# Patient Record
Sex: Female | Born: 1985 | Race: White | Hispanic: No | Marital: Married | Smoking: Never smoker
Health system: Southern US, Community
[De-identification: ages and names within clinical notes are randomized; demographics above are authoritative.]

## PROBLEM LIST (undated history)

## (undated) ENCOUNTER — Inpatient Hospital Stay (HOSPITAL_COMMUNITY): Payer: Self-pay

## (undated) DIAGNOSIS — K219 Gastro-esophageal reflux disease without esophagitis: Secondary | ICD-10-CM

## (undated) DIAGNOSIS — F419 Anxiety disorder, unspecified: Secondary | ICD-10-CM

## (undated) DIAGNOSIS — E039 Hypothyroidism, unspecified: Secondary | ICD-10-CM

## (undated) HISTORY — PX: WISDOM TOOTH EXTRACTION: SHX21

---

## 2017-06-15 ENCOUNTER — Encounter (HOSPITAL_COMMUNITY): Payer: Self-pay | Admitting: *Deleted

## 2017-06-15 ENCOUNTER — Other Ambulatory Visit: Payer: Self-pay

## 2017-06-15 NOTE — Anesthesia Preprocedure Evaluation (Addendum)
Anesthesia Evaluation  Patient identified by MRN, date of birth, ID band Patient awake    Reviewed: Allergy & Precautions, NPO status , Patient's Chart, lab work & pertinent test results  Airway Mallampati: II  TM Distance: >3 FB Neck ROM: Full    Dental no notable dental hx. (+) Dental Advisory Given, Teeth Intact   Pulmonary neg pulmonary ROS,    Pulmonary exam normal breath sounds clear to auscultation       Cardiovascular Exercise Tolerance: Good negative cardio ROS Normal cardiovascular exam Rhythm:Regular Rate:Normal     Neuro/Psych negative neurological ROS  negative psych ROS   GI/Hepatic negative GI ROS, Neg liver ROS,   Endo/Other  negative endocrine ROS  Renal/GU negative Renal ROS  negative genitourinary   Musculoskeletal negative musculoskeletal ROS (+)   Abdominal   Peds negative pediatric ROS (+)  Hematology negative hematology ROS (+)   Anesthesia Other Findings   Reproductive/Obstetrics negative OB ROS                             Anesthesia Physical Anesthesia Plan  ASA: II  Anesthesia Plan: General   Post-op Pain Management:    Induction: Intravenous  PONV Risk Score and Plan: Dexamethasone and Ondansetron  Airway Management Planned: LMA  Additional Equipment:   Intra-op Plan:   Post-operative Plan:   Informed Consent: I have reviewed the patients History and Physical, chart, labs and discussed the procedure including the risks, benefits and alternatives for the proposed anesthesia with the patient or authorized representative who has indicated his/her understanding and acceptance.     Plan Discussed with: CRNA  Anesthesia Plan Comments:         Anesthesia Quick Evaluation

## 2017-06-15 NOTE — H&P (Signed)
Anna Long is an 32 y.o. female [phone] with unfortunate finding of missed miscarriage.  She had [place] confirmed non-viable IUP with lagging CRL fetus that lost FHT's on 2 subsequent [place] with no growth.  We reviewed all options and she tried expectant management for one week followed by cytotec with no significant bleeding.  At this point she wants to proceed with D&E.    Pertinent Gynecological History: OB History: SAB x 1          C-section x 1-- 2015   Menstrual History:  Patient's last menstrual period was 04/04/2017.    Past Medical History:  Diagnosis Date  . Anxiety    no meds currently  . GERD (gastroesophageal reflux disease)    no meds, diet controlled  . Hypothyroidism     Past Surgical History:  Procedure Laterality Date  . CESAREAN SECTION     x 1 in [facility]  . WISDOM TOOTH EXTRACTION      History reviewed. No pertinent family history.  Social History:  reports that she has never smoked. She has never used smokeless tobacco. She reports that she drank alcohol. She reports that she does not use drugs.  Allergies:  Allergies  Allergen Reactions  . Zithromax [Azithromycin] Other (See Comments)    Was told not to take this medication by PCP    No medications prior to admission.    Review of Systems  Gastrointestinal: Negative for abdominal pain.  Neurological: Negative for headaches.    Height 5' (1.524 m), weight 135 lb (61.2 kg), last menstrual period 04/04/2017. Physical Exam  Constitutional: She appears well-developed.  Cardiovascular: Normal rate and regular rhythm.  Respiratory: Effort normal.  GI: Soft.  Genitourinary: Vagina normal.  Musculoskeletal: Normal range of motion.  Psychiatric: She has a normal mood and affect.    No results found for this or any previous visit (from the past 24 hour(s)).  No results found.  Assessment/Plan: Pt was counseled on risks and benefits of dilation and evacuation including bleeding, infection  and uterine perforation.  She will use cytotec 3 hours prior to procedure to help dilate the cervix.  We will confirm blood type in hospital.   Oliver Pila 06/15/2017, 6:13 PM

## 2017-06-16 ENCOUNTER — Ambulatory Visit (HOSPITAL_COMMUNITY)
Admission: RE | Admit: 2017-06-16 | Discharge: 2017-06-16 | Disposition: A | Discharge status: Home / Self Care | Payer: Self-pay | Source: Ambulatory Visit | Attending: Obstetrics and Gynecology | Admitting: Obstetrics and Gynecology

## 2017-06-16 ENCOUNTER — Encounter (HOSPITAL_COMMUNITY)
Admission: RE | Disposition: A | Discharge status: Home / Self Care | Payer: Self-pay | Source: Ambulatory Visit | Attending: Obstetrics and Gynecology

## 2017-06-16 ENCOUNTER — Ambulatory Visit (HOSPITAL_COMMUNITY): Payer: Self-pay | Admitting: Anesthesiology

## 2017-06-16 ENCOUNTER — Encounter (HOSPITAL_COMMUNITY): Payer: Self-pay | Admitting: Anesthesiology

## 2017-06-16 DIAGNOSIS — K219 Gastro-esophageal reflux disease without esophagitis: Secondary | ICD-10-CM | POA: Insufficient documentation

## 2017-06-16 DIAGNOSIS — O021 Missed abortion: Secondary | ICD-10-CM | POA: Insufficient documentation

## 2017-06-16 HISTORY — DX: Hypothyroidism, unspecified: E03.9

## 2017-06-16 HISTORY — DX: Anxiety disorder, unspecified: F41.9

## 2017-06-16 HISTORY — DX: Gastro-esophageal reflux disease without esophagitis: K21.9

## 2017-06-16 HISTORY — PX: DILATION AND EVACUATION: SHX1459

## 2017-06-16 LAB — CBC
HEMATOCRIT: 38.8 % (ref 36.0–46.0)
Hemoglobin: 13.2 g/dL (ref 12.0–15.0)
MCH: 32 pg (ref 26.0–34.0)
MCHC: 34 g/dL (ref 30.0–36.0)
MCV: 93.9 fL (ref 78.0–100.0)
PLATELETS: 312 10*3/uL (ref 150–400)
RBC: 4.13 MIL/uL (ref 3.87–5.11)
RDW: 14.9 % (ref 11.5–15.5)
WBC: 7.6 10*3/uL (ref 4.0–10.5)

## 2017-06-16 LAB — TYPE AND SCREEN
ABO/RH(D): O POS
Antibody Screen: NEGATIVE

## 2017-06-16 LAB — ABO/RH: ABO/RH(D): O POS

## 2017-06-16 SURGERY — DILATION AND EVACUATION, UTERUS
Anesthesia: General | Site: Vagina

## 2017-06-16 MED ORDER — PROPOFOL 10 MG/ML IV BOLUS
INTRAVENOUS | Status: AC
  Filled 2017-06-16: qty 40

## 2017-06-16 MED ORDER — ACETAMINOPHEN 10 MG/ML IV SOLN: 1000.0000 mg | Freq: Once | INTRAVENOUS | Status: DC | PRN

## 2017-06-16 MED ORDER — SCOPOLAMINE 1 MG/3DAYS TD PT72
MEDICATED_PATCH | TRANSDERMAL | Status: AC
  Administered 2017-06-16: 1.5 mg via TRANSDERMAL
  Filled 2017-06-16: qty 1

## 2017-06-16 MED ORDER — FENTANYL CITRATE (PF) 100 MCG/2ML IJ SOLN
INTRAMUSCULAR | Status: AC
  Filled 2017-06-16: qty 4

## 2017-06-16 MED ORDER — GLYCOPYRROLATE 0.2 MG/ML IJ SOLN
INTRAMUSCULAR | Status: DC | PRN
  Administered 2017-06-16: .05 mg via INTRAVENOUS

## 2017-06-16 MED ORDER — LIDOCAINE HCL 1 % IJ SOLN
INTRAMUSCULAR | Status: DC | PRN
  Administered 2017-06-16: 20 mL

## 2017-06-16 MED ORDER — FENTANYL CITRATE (PF) 250 MCG/5ML IJ SOLN
INTRAMUSCULAR | Status: DC | PRN
  Administered 2017-06-16: 100 ug via INTRAVENOUS

## 2017-06-16 MED ORDER — KETOROLAC TROMETHAMINE 30 MG/ML IJ SOLN
INTRAMUSCULAR | Status: AC
  Filled 2017-06-16: qty 1

## 2017-06-16 MED ORDER — DEXAMETHASONE SODIUM PHOSPHATE 4 MG/ML IJ SOLN
INTRAMUSCULAR | Status: DC | PRN
Start: 2017-06-16 — End: 2017-06-16
  Administered 2017-06-16: 10 mg via INTRAVENOUS

## 2017-06-16 MED ORDER — METOCLOPRAMIDE HCL 5 MG/ML IJ SOLN
INTRAMUSCULAR | Status: DC | PRN
  Administered 2017-06-16: 10 mg via INTRAVENOUS

## 2017-06-16 MED ORDER — METOCLOPRAMIDE HCL 5 MG/ML IJ SOLN
INTRAMUSCULAR | Status: AC
  Filled 2017-06-16: qty 2

## 2017-06-16 MED ORDER — LIDOCAINE HCL 1 % IJ SOLN
INTRAMUSCULAR | Status: AC
  Filled 2017-06-16: qty 20

## 2017-06-16 MED ORDER — HYDROCODONE-ACETAMINOPHEN 7.5-325 MG PO TABS: 1.0000 | ORAL_TABLET | Freq: Once | ORAL | Status: DC | PRN

## 2017-06-16 MED ORDER — PROMETHAZINE HCL 25 MG/ML IJ SOLN: 6.2500 mg | INTRAMUSCULAR | Status: DC | PRN

## 2017-06-16 MED ORDER — DIPHENHYDRAMINE HCL 50 MG/ML IJ SOLN
INTRAMUSCULAR | Status: DC | PRN
  Administered 2017-06-16: 12.5 mg via INTRAVENOUS

## 2017-06-16 MED ORDER — PROPOFOL 10 MG/ML IV BOLUS
INTRAVENOUS | Status: DC | PRN
  Administered 2017-06-16: 160 mg via INTRAVENOUS

## 2017-06-16 MED ORDER — GLYCOPYRROLATE 0.2 MG/ML IJ SOLN
INTRAMUSCULAR | Status: AC
  Filled 2017-06-16: qty 1

## 2017-06-16 MED ORDER — LACTATED RINGERS IV SOLN
INTRAVENOUS | Status: DC
  Administered 2017-06-16: 125 mL/h via INTRAVENOUS
  Administered 2017-06-16: 15:00:00 via INTRAVENOUS

## 2017-06-16 MED ORDER — MIDAZOLAM HCL 2 MG/2ML IJ SOLN
INTRAMUSCULAR | Status: AC
  Filled 2017-06-16: qty 2

## 2017-06-16 MED ORDER — LIDOCAINE HCL (CARDIAC) PF 100 MG/5ML IV SOSY
PREFILLED_SYRINGE | INTRAVENOUS | Status: AC
  Filled 2017-06-16: qty 5

## 2017-06-16 MED ORDER — MEPERIDINE HCL 25 MG/ML IJ SOLN
6.2500 mg | INTRAMUSCULAR | Status: DC | PRN
  Administered 2017-06-16: 6.25 mg via INTRAVENOUS

## 2017-06-16 MED ORDER — DIPHENHYDRAMINE HCL 50 MG/ML IJ SOLN
INTRAMUSCULAR | Status: AC
  Filled 2017-06-16: qty 1

## 2017-06-16 MED ORDER — MIDAZOLAM HCL 2 MG/2ML IJ SOLN
INTRAMUSCULAR | Status: DC | PRN
  Administered 2017-06-16: 1 mg via INTRAVENOUS

## 2017-06-16 MED ORDER — LIDOCAINE HCL (CARDIAC) PF 100 MG/5ML IV SOSY
PREFILLED_SYRINGE | INTRAVENOUS | Status: DC | PRN
  Administered 2017-06-16: 100 mg via INTRAVENOUS

## 2017-06-16 MED ORDER — ONDANSETRON HCL 4 MG/2ML IJ SOLN
INTRAMUSCULAR | Status: AC
Start: 2017-06-16 — End: ?
  Filled 2017-06-16: qty 2

## 2017-06-16 MED ORDER — IBUPROFEN 600 MG PO TABS: 600.0000 mg | ORAL_TABLET | Freq: Four times a day (QID) | ORAL | 0 refills | Status: DC | PRN

## 2017-06-16 MED ORDER — HYDROMORPHONE HCL 1 MG/ML IJ SOLN: 0.2500 mg | INTRAMUSCULAR | Status: DC | PRN

## 2017-06-16 MED ORDER — LACTATED RINGERS IV SOLN: INTRAVENOUS | Status: DC

## 2017-06-16 MED ORDER — SCOPOLAMINE 1 MG/3DAYS TD PT72
1.0000 | MEDICATED_PATCH | Freq: Once | TRANSDERMAL | Status: DC
  Administered 2017-06-16: 1.5 mg via TRANSDERMAL

## 2017-06-16 MED ORDER — KETOROLAC TROMETHAMINE 30 MG/ML IJ SOLN
INTRAMUSCULAR | Status: DC | PRN
  Administered 2017-06-16: 30 mg via INTRAVENOUS

## 2017-06-16 MED ORDER — MEPERIDINE HCL 25 MG/ML IJ SOLN
INTRAMUSCULAR | Status: AC
  Filled 2017-06-16: qty 1

## 2017-06-16 MED ORDER — ONDANSETRON HCL 4 MG/2ML IJ SOLN
INTRAMUSCULAR | Status: DC | PRN
  Administered 2017-06-16: 4 mg via INTRAVENOUS

## 2017-06-16 MED ORDER — DEXAMETHASONE SODIUM PHOSPHATE 10 MG/ML IJ SOLN
INTRAMUSCULAR | Status: AC
  Filled 2017-06-16: qty 1

## 2017-06-16 SURGICAL SUPPLY — 18 items
CATH ROBINSON RED A/P 16FR (CATHETERS) ×3 IMPLANT
DECANTER SPIKE VIAL GLASS SM (MISCELLANEOUS) ×3 IMPLANT
GLOVE BIO SURGEON STRL SZ 6.5 (GLOVE) ×2 IMPLANT
GLOVE BIO SURGEONS STRL SZ 6.5 (GLOVE) ×1
GLOVE BIOGEL PI IND STRL 7.0 (GLOVE) ×1 IMPLANT
GLOVE BIOGEL PI INDICATOR 7.0 (GLOVE) ×2
GOWN STRL REUS W/TWL LRG LVL3 (GOWN DISPOSABLE) ×6 IMPLANT
KIT BERKELEY 1ST TRIMESTER 3/8 (MISCELLANEOUS) ×3 IMPLANT
NS IRRIG 1000ML POUR BTL (IV SOLUTION) ×3 IMPLANT
PACK VAGINAL MINOR WOMEN LF (CUSTOM PROCEDURE TRAY) ×3 IMPLANT
PAD OB MATERNITY 4.3X12.25 (PERSONAL CARE ITEMS) ×3 IMPLANT
PAD PREP 24X48 CUFFED NSTRL (MISCELLANEOUS) ×3 IMPLANT
SET BERKELEY SUCTION TUBING (SUCTIONS) ×3 IMPLANT
TOWEL OR 17X24 6PK STRL BLUE (TOWEL DISPOSABLE) ×6 IMPLANT
VACURETTE 10 RIGID CVD (CANNULA) IMPLANT
VACURETTE 7MM CVD STRL WRAP (CANNULA) IMPLANT
VACURETTE 8 RIGID CVD (CANNULA) ×3 IMPLANT
VACURETTE 9 RIGID CVD (CANNULA) IMPLANT

## 2017-06-16 NOTE — Anesthesia Postprocedure Evaluation (Signed)
Anesthesia Post Note  Patient: Anna Long  Procedure(s) Performed: DILATATION AND EVACUATION (N/A Vagina )     Patient location during evaluation: PACU Anesthesia Type: General Level of consciousness: awake and alert Pain management: pain level controlled Vital Signs Assessment: post-procedure vital signs reviewed and stable Respiratory status: spontaneous breathing, nonlabored ventilation, respiratory function stable and patient connected to nasal cannula oxygen Cardiovascular status: blood pressure returned to baseline and stable Postop Assessment: no apparent nausea or vomiting Anesthetic complications: no    Last Vitals:  Vitals:   06/16/17 1430 06/16/17 1445  BP: 101/71 99/69  Pulse: 76 74  Resp: 20 16  Temp:    SpO2: 100% 98%    Last Pain:  Vitals:   06/16/17 1445  TempSrc:   PainSc: 0-No pain   Pain Goal: Patients Stated Pain Goal: 3 (06/16/17 1445)               Trevor Iha

## 2017-06-16 NOTE — Discharge Instructions (Signed)
DISCHARGE INSTRUCTIONS: D&C / D&E The following instructions have been prepared to help you care for yourself upon your return home.   Personal hygiene:  Use sanitary pads for vaginal drainage, not tampons.  Shower the day after your procedure.  NO tub baths, pools or Jacuzzis for 2-3 weeks.  Wipe front to back after using the bathroom.  Activity and limitations:  Do NOT drive or operate any equipment for 24 hours. The effects of anesthesia are still present and drowsiness may result.  Do NOT rest in bed all day.  Walking is encouraged.  Walk up and down stairs slowly.  You may resume your normal activity in one to two days or as indicated by your physician.  Sexual activity: NO intercourse for at least 2 weeks after the procedure, or as indicated by your physician.  Diet: Eat a light meal as desired this evening. You may resume your usual diet tomorrow.  Return to work: You may resume your work activities in one to two days or as indicated by your doctor.  What to expect after your surgery: Expect to have vaginal bleeding/discharge for 2-3 days and spotting for up to 10 days. It is not unusual to have soreness for up to 1-2 weeks. You may have a slight burning sensation when you urinate for the first day. Mild cramps may continue for a couple of days. You may have a regular period in 2-6 weeks.  Call your doctor for any of the following:  Excessive vaginal bleeding, saturating and changing one pad every hour.  Inability to urinate 6 hours after discharge from hospital.  Pain not relieved by pain medication.  Fever of 100.4 F or greater.  Unusual vaginal discharge or odor.  Special Instructions/Symptoms: Your throat may feel dry or sore from the anesthesia or the breathing tube placed in your throat during surgery. If this causes discomfort, gargle with warm salt water. The discomfort should disappear within 24 hours.  If you had a scopolamine patch placed behind your  ear for the management of post- operative nausea and/or vomiting:  1. The medication in the patch is effective for 72 hours, after which it should be removed.  Wrap patch in a tissue and discard in the trash. Wash hands thoroughly with soap and water. 2. You may remove the patch earlier than 72 hours if you experience unpleasant side effects which may include dry mouth, dizziness or visual disturbances. 3. Avoid touching the patch. Wash your hands with soap and water after contact with the patch.

## 2017-06-16 NOTE — Transfer of Care (Signed)
Immediate Anesthesia Transfer of Care Note  Patient: Anna Long  Procedure(s) Performed: DILATATION AND EVACUATION (N/A Vagina )  Patient Location: PACU  Anesthesia Type:General  Level of Consciousness: awake, alert  and oriented  Airway & Oxygen Therapy: Patient Spontanous Breathing and Patient connected to nasal cannula oxygen  Post-op Assessment: Report given to RN and Post -op Vital signs reviewed and stable  Post vital signs: Reviewed and stable  Last Vitals:  Vitals Value Taken Time  BP    Temp    Pulse 97 06/16/2017  2:23 PM  Resp    SpO2 100 % 06/16/2017  2:23 PM  Vitals shown include unvalidated device data.  Last Pain:  Vitals:   06/16/17 1208  TempSrc: Oral  PainSc: 0-No pain      Patients Stated Pain Goal: 3 (06/16/17 1208)  Complications: No apparent anesthesia complications

## 2017-06-16 NOTE — Progress Notes (Signed)
Patient ID: Anna Long, female   DOB: 1985/08/17, 32 y.o.   MRN: [id number] Per pt no changes in dictated H&P.  No further VB.  She feels minimal cramping.  Brief exam WNL.

## 2017-06-16 NOTE — Anesthesia Procedure Notes (Signed)
Procedure Name: LMA Insertion Date/Time: 06/16/2017 1:46 PM Performed by: Graciela Husbands, CRNA Pre-anesthesia Checklist: Patient identified, Patient being monitored, Emergency Drugs available, Timeout performed and Suction available Patient Re-evaluated:Patient Re-evaluated prior to induction Oxygen Delivery Method: Circle System Utilized Preoxygenation: Pre-oxygenation with 100% oxygen Induction Type: IV induction Ventilation: Mask ventilation without difficulty LMA: LMA inserted LMA Size: 4.0 Number of attempts: 1 Placement Confirmation: positive ETCO2 and breath sounds checked- equal and bilateral Tube secured with: Tape Dental Injury: Teeth and Oropharynx as per pre-operative assessment

## 2017-06-16 NOTE — Op Note (Signed)
Operative Note    Preoperative Diagnosis Missed miscarriage at 9+weeks  Postoperative Diagnosis same  Procedure Suction Dilation and Evacuation  Surgeon Huel Cote, MD  Anesthesia LMA  Fluids: EBL 75mL UOP 75mL IVF  Findings Uterus retroverted and 9-10 weeks size.  Moderate amount of POC's obtained  Specimen POC's  Procedure Note Pt was taken to operating room where LMA anesthesia was obtained without difficulty.  She was prepped and draped in the normal sterile fashion in the dorsal lithotomy position.  An appropriate timeout was performed.  A speculum was placed in the vagina and 2cc of 1% plain lidocaine injected into the anterior cervix.  An additional 9cc was placed both at 2 and 10 o'clock for a paracervical block.  A single tooth tenaculum was placed on the anterior lip of the cervix. The cervix was slightly dilated from the pt's cytotec treatment and  sound was easily introduced into the uterus and the uterus sounded to about 9cm.  The 8 mm suction currette was obtained and introduced into the fundus.  With several passes, a moderate amount of tissue was obtained.  When no further tissue was noted, the suction was discontinued and a gentle currettage performed.  One additional pass revealed no further tissue, and the procedure was completed.  The tenaculum was removed and silver nitrate used for hemostasis.  All instruments and sponges were removed from vagina and the patient awakened and taken to the PACU in good condition.

## 2017-06-17 ENCOUNTER — Encounter (HOSPITAL_COMMUNITY): Payer: Self-pay | Admitting: Obstetrics and Gynecology

## 2018-02-02 NOTE — L&D Delivery Note (Signed)
Delivery Note Pt progressed to complete dilation and pushed great about 15 minutes.  At 1:27 PM a non-viable female was delivered via VBAC, Spontaneous (Presentation: OA ).  APGAR:0 ,0 ; weight pending .   Placenta status: placenta delivered with the baby, intact.   Cord:  with the following complications: none .    Anesthesia:  Epidural Episiotomy: None Lacerations: None Suture Repair: none Est. Blood Loss (mL): 146mL  Baby left with parents and d/w them they could have as much time as needed.  We discussed options of burial and cremation and they will consider. Pt will let me know if wants d/c later this PM Placenta not sent to path given known cause of fetal death.  Logan Bores 12/25/18, 1:47 PM

## 2018-11-23 ENCOUNTER — Telehealth: Payer: Self-pay | Admitting: Neonatology

## 2018-11-23 NOTE — Progress Notes (Signed)
Telemedicine consultation (via WebEx) requested by Dr. 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  I spoke with patient and her husband via WebEx, discussing expectations for care of baby after his birth (planned repeat C/section probably about [redacted] wks gestation).  They expressed desire for comfort care but would like to have him supported as needed until he can be further assessed postnatally (echocardiogram, chromosomes, etc.). They indicated they would probably not want heroic or invasive interventions if Trisomy 18 is the clinical impression (not necessarily waiting for chromosomal confirmation). They hope he will survive at least long enough to be discharged home, possibly with NG feedings and other support if needed.  They also requested his sibling be allowed to visit in the hospital if survival to discharge is not possible.  We discussed possible admission to NICU couplet care (if resources are available), agreed to make decisions about ongoing respiratory support, fluids, nutrition, and further diagnostic evaluation after his birth.  Will ask Dr. Marvel Plan to update me with any changes in mother's or baby's  clinical condition or delivery plans.  Also advised patient and FOB to contact me via Idell Pickles, [phone] if they have further questions/concerns prior to her admission for delivery.  Thank you for consulting Neonatology. Brent Taillon E. Burney Gauze., MD Neonatologist  Total time 45 minutes - face-to-face (via WebEx) 25 minutes.  Marland Kitchen

## 2018-11-24 ENCOUNTER — Encounter: Payer: Self-pay | Admitting: Neonatology

## 2018-11-24 DIAGNOSIS — O351XX0 Maternal care for (suspected) chromosomal abnormality in fetus, not applicable or unspecified: Secondary | ICD-10-CM | POA: Insufficient documentation

## 2018-11-24 DIAGNOSIS — O3512X0 Maternal care for (suspected) chromosomal abnormality in fetus, trisomy 18, not applicable or unspecified: Secondary | ICD-10-CM | POA: Insufficient documentation

## 2018-12-07 ENCOUNTER — Other Ambulatory Visit: Payer: Self-pay | Admitting: Obstetrics and Gynecology

## 2018-12-07 NOTE — H&P (Signed)
Anna Long is a 33 y.o. female [id number] at 45 0/7 weeks(EDD 02/23/19 by LMP c/w 6 week [place]) with IUFD  of known trisomy 96 baby by [facility] that patient had elected to manage expectantly.   Pt has a h/o prior c-section for fetal indications thus is a VBAC.  She also has a h/o hypothyroidism and labs stable on medications.  She has anxiety as well, coping with no medications.    Past Medical History:  Diagnosis Date  . Anxiety    no meds currently  . GERD (gastroesophageal reflux disease)    no meds, diet controlled  . Hypothyroidism    Past Surgical History:  Procedure Laterality Date  . CESAREAN SECTION     x 1 in [place] N/A 06/16/2017   Procedure: DILATATION AND EVACUATION;  Surgeon: Paula Compton, MD;  Location: [facility];  Service: Gynecology;  Laterality: N/A;  . WISDOM TOOTH EXTRACTION     Family History: family history is not on file. Social History:  reports that she has never smoked. She has never used smokeless tobacco. She reports previous alcohol use. She reports that she does not use drugs.     Maternal Diabetes: No Genetic Screening: Abnormal:  Results: Trisomy 18 Maternal Ultrasounds/Referrals: Normal Fetal Ultrasounds or other Referrals:  None Maternal Substance Abuse:  No Significant Maternal Medications:  Meds include: Syntroid Significant Maternal Lab Results:  None Other Comments:  None  Review of Systems  Cardiovascular: Negative for palpitations.  Gastrointestinal: Negative for abdominal pain and nausea.   Maternal Medical History:  Reason for admission: Nausea.  Contractions: Frequency: rare.    Fetal activity: Perceived fetal activity is none.    Prenatal complications: IUFD of trisomy 62 baby Prior LTCS h/o SAB x 2 Hypothyroidism AMA      There were no vitals taken for this visit. Maternal Exam:  Uterine Assessment: Contraction strength is mild.  Contraction frequency is rare.   Abdomen: Patient  reports no abdominal tenderness. Surgical scars: low transverse.   Fetal presentation: vertex  Introitus: Normal vulva. Normal vagina.    Physical Exam  Constitutional: She appears well-developed.  Cardiovascular: Normal rate and regular rhythm.  Respiratory: Effort normal.  GI: Soft.  Genitourinary:    Vulva and vagina normal.   Neurological: She is alert.  Psychiatric: She has a normal mood and affect.    Prenatal labs: ABO, Rh:   O positive Antibody:  negative Rubella:   immune RPR:   NR HBsAg:   neg HIV:   NR One hour GCT 145 Three hour GTT WNL     Assessment/Plan: Pt with IUFD at 84 weeks with h/o prior c-section.  Attempted to place a foley bulb but patient was not able to tolerate. We will start pitocin and see how responds.  Plan d/w pt and husband in detail.  Epidural when needed.   Anna Long 12/07/2018, 6:05 PM

## 2018-12-07 NOTE — H&P (Deleted)
  The note originally documented on this encounter has been moved the the encounter in which it belongs.  

## 2018-12-08 ENCOUNTER — Encounter (HOSPITAL_COMMUNITY): Payer: Self-pay

## 2018-12-08 ENCOUNTER — Inpatient Hospital Stay (HOSPITAL_COMMUNITY): Payer: Self-pay

## 2018-12-08 ENCOUNTER — Inpatient Hospital Stay (HOSPITAL_COMMUNITY): Payer: Self-pay | Admitting: Anesthesiology

## 2018-12-08 ENCOUNTER — Other Ambulatory Visit: Payer: Self-pay

## 2018-12-08 ENCOUNTER — Inpatient Hospital Stay (HOSPITAL_COMMUNITY)
Admission: AD | Admit: 2018-12-08 | Discharge: 2018-12-09 | DRG: 807 | Disposition: A | Discharge status: Home / Self Care | Payer: Self-pay | Attending: Obstetrics and Gynecology | Admitting: Obstetrics and Gynecology

## 2018-12-08 DIAGNOSIS — O351XX0 Maternal care for (suspected) chromosomal abnormality in fetus, not applicable or unspecified: Secondary | ICD-10-CM | POA: Diagnosis present

## 2018-12-08 DIAGNOSIS — O99284 Endocrine, nutritional and metabolic diseases complicating childbirth: Secondary | ICD-10-CM | POA: Diagnosis present

## 2018-12-08 DIAGNOSIS — F419 Anxiety disorder, unspecified: Secondary | ICD-10-CM | POA: Diagnosis present

## 2018-12-08 DIAGNOSIS — O99344 Other mental disorders complicating childbirth: Secondary | ICD-10-CM | POA: Diagnosis present

## 2018-12-08 DIAGNOSIS — O364XX0 Maternal care for intrauterine death, not applicable or unspecified: Principal | ICD-10-CM | POA: Diagnosis present

## 2018-12-08 DIAGNOSIS — O34211 Maternal care for low transverse scar from previous cesarean delivery: Secondary | ICD-10-CM | POA: Diagnosis present

## 2018-12-08 DIAGNOSIS — Z20828 Contact with and (suspected) exposure to other viral communicable diseases: Secondary | ICD-10-CM | POA: Diagnosis present

## 2018-12-08 DIAGNOSIS — E039 Hypothyroidism, unspecified: Secondary | ICD-10-CM | POA: Diagnosis present

## 2018-12-08 DIAGNOSIS — Z3A29 29 weeks gestation of pregnancy: Secondary | ICD-10-CM

## 2018-12-08 LAB — CBC
HCT: 38.2 % (ref 36.0–46.0)
Hemoglobin: 13.1 g/dL (ref 12.0–15.0)
MCH: 31.7 pg (ref 26.0–34.0)
MCHC: 34.3 g/dL (ref 30.0–36.0)
MCV: 92.5 fL (ref 80.0–100.0)
Platelets: 290 10*3/uL (ref 150–400)
RBC: 4.13 MIL/uL (ref 3.87–5.11)
RDW: 13.7 % (ref 11.5–15.5)
WBC: 9 10*3/uL (ref 4.0–10.5)
nRBC: 0 % (ref 0.0–0.2)

## 2018-12-08 LAB — RPR: RPR Ser Ql: NONREACTIVE

## 2018-12-08 LAB — TYPE AND SCREEN
ABO/RH(D): O POS
Antibody Screen: NEGATIVE

## 2018-12-08 LAB — ABO/RH: ABO/RH(D): O POS

## 2018-12-08 LAB — SARS CORONAVIRUS 2 BY RT PCR (HOSPITAL ORDER, PERFORMED IN ~~LOC~~ HOSPITAL LAB): SARS Coronavirus 2: NEGATIVE

## 2018-12-08 MED ORDER — ACETAMINOPHEN 325 MG PO TABS
650.0000 mg | ORAL_TABLET | ORAL | Status: DC | PRN
  Administered 2018-12-08: 650 mg via ORAL
  Filled 2018-12-08: qty 2

## 2018-12-08 MED ORDER — LIDOCAINE HCL (PF) 1 % IJ SOLN: 30.0000 mL | INTRAMUSCULAR | Status: DC | PRN

## 2018-12-08 MED ORDER — DIBUCAINE (PERIANAL) 1 % EX OINT: 1.0000 "application " | TOPICAL_OINTMENT | CUTANEOUS | Status: DC | PRN

## 2018-12-08 MED ORDER — LEVOTHYROXINE SODIUM 25 MCG PO TABS
75.0000 ug | ORAL_TABLET | Freq: Every day | ORAL | Status: DC
  Administered 2018-12-09: 75 ug via ORAL
  Filled 2018-12-08: qty 3

## 2018-12-08 MED ORDER — WITCH HAZEL-GLYCERIN EX PADS: 1.0000 "application " | MEDICATED_PAD | CUTANEOUS | Status: DC | PRN

## 2018-12-08 MED ORDER — BENZOCAINE-MENTHOL 20-0.5 % EX AERO: 1.0000 "application " | INHALATION_SPRAY | CUTANEOUS | Status: DC | PRN

## 2018-12-08 MED ORDER — PHENYLEPHRINE 40 MCG/ML (10ML) SYRINGE FOR IV PUSH (FOR BLOOD PRESSURE SUPPORT): 80.0000 ug | PREFILLED_SYRINGE | INTRAVENOUS | Status: DC | PRN

## 2018-12-08 MED ORDER — LACTATED RINGERS IV SOLN: 500.0000 mL | Freq: Once | INTRAVENOUS | Status: DC

## 2018-12-08 MED ORDER — OXYCODONE-ACETAMINOPHEN 5-325 MG PO TABS: 1.0000 | ORAL_TABLET | ORAL | Status: DC | PRN

## 2018-12-08 MED ORDER — DIPHENHYDRAMINE HCL 50 MG/ML IJ SOLN
12.5000 mg | INTRAMUSCULAR | Status: DC | PRN
  Administered 2018-12-08: 12.5 mg via INTRAVENOUS
  Filled 2018-12-08: qty 1

## 2018-12-08 MED ORDER — OXYTOCIN BOLUS FROM INFUSION
500.0000 mL | Freq: Once | INTRAVENOUS | Status: AC
  Administered 2018-12-08: 500 mL via INTRAVENOUS

## 2018-12-08 MED ORDER — SOD CITRATE-CITRIC ACID 500-334 MG/5ML PO SOLN: 30.0000 mL | ORAL | Status: DC | PRN

## 2018-12-08 MED ORDER — ONDANSETRON HCL 4 MG/2ML IJ SOLN: 4.0000 mg | INTRAMUSCULAR | Status: DC | PRN

## 2018-12-08 MED ORDER — SENNOSIDES-DOCUSATE SODIUM 8.6-50 MG PO TABS
2.0000 | ORAL_TABLET | ORAL | Status: DC
  Administered 2018-12-08: 2 via ORAL
  Filled 2018-12-08: qty 2

## 2018-12-08 MED ORDER — LACTATED RINGERS IV SOLN: 500.0000 mL | INTRAVENOUS | Status: DC | PRN

## 2018-12-08 MED ORDER — SODIUM CHLORIDE (PF) 0.9 % IJ SOLN
INTRAMUSCULAR | Status: DC | PRN
  Administered 2018-12-08: 12 mL/h via EPIDURAL

## 2018-12-08 MED ORDER — OXYTOCIN 40 UNITS IN NORMAL SALINE INFUSION - SIMPLE MED
1.0000 m[IU]/min | INTRAVENOUS | Status: DC
  Administered 2018-12-08: 1 m[IU]/min via INTRAVENOUS
  Filled 2018-12-08: qty 1000

## 2018-12-08 MED ORDER — OXYTOCIN 40 UNITS IN NORMAL SALINE INFUSION - SIMPLE MED: 2.5000 [IU]/h | INTRAVENOUS | Status: DC

## 2018-12-08 MED ORDER — ZOLPIDEM TARTRATE 5 MG PO TABS: 5.0000 mg | ORAL_TABLET | Freq: Every evening | ORAL | Status: DC | PRN

## 2018-12-08 MED ORDER — DIPHENHYDRAMINE HCL 25 MG PO CAPS: 25.0000 mg | ORAL_CAPSULE | Freq: Four times a day (QID) | ORAL | Status: DC | PRN

## 2018-12-08 MED ORDER — TETANUS-DIPHTH-ACELL PERTUSSIS 5-2.5-18.5 LF-MCG/0.5 IM SUSP: 0.5000 mL | Freq: Once | INTRAMUSCULAR | Status: DC

## 2018-12-08 MED ORDER — EPHEDRINE 5 MG/ML INJ: 10.0000 mg | INTRAVENOUS | Status: DC | PRN

## 2018-12-08 MED ORDER — LACTATED RINGERS IV SOLN
INTRAVENOUS | Status: DC
  Administered 2018-12-08: 01:00:00 via INTRAVENOUS

## 2018-12-08 MED ORDER — FENTANYL-BUPIVACAINE-NACL 0.5-0.125-0.9 MG/250ML-% EP SOLN
12.0000 mL/h | EPIDURAL | Status: DC | PRN
  Filled 2018-12-08: qty 250

## 2018-12-08 MED ORDER — IBUPROFEN 600 MG PO TABS
600.0000 mg | ORAL_TABLET | Freq: Four times a day (QID) | ORAL | Status: DC
  Administered 2018-12-08 – 2018-12-09 (×3): 600 mg via ORAL
  Filled 2018-12-08 (×3): qty 1

## 2018-12-08 MED ORDER — FENTANYL CITRATE (PF) 100 MCG/2ML IJ SOLN: 50.0000 ug | INTRAMUSCULAR | Status: DC | PRN

## 2018-12-08 MED ORDER — PRENATAL MULTIVITAMIN CH: 1.0000 | ORAL_TABLET | Freq: Every day | ORAL | Status: DC

## 2018-12-08 MED ORDER — OXYCODONE-ACETAMINOPHEN 5-325 MG PO TABS: 2.0000 | ORAL_TABLET | ORAL | Status: DC | PRN

## 2018-12-08 MED ORDER — SIMETHICONE 80 MG PO CHEW: 80.0000 mg | CHEWABLE_TABLET | ORAL | Status: DC | PRN

## 2018-12-08 MED ORDER — COCONUT OIL OIL: 1.0000 "application " | TOPICAL_OIL | Status: DC | PRN

## 2018-12-08 MED ORDER — LIDOCAINE HCL (PF) 1 % IJ SOLN
INTRAMUSCULAR | Status: DC | PRN
  Administered 2018-12-08: 2 mL via EPIDURAL
  Administered 2018-12-08: 10 mL via EPIDURAL

## 2018-12-08 MED ORDER — ONDANSETRON HCL 4 MG/2ML IJ SOLN: 4.0000 mg | Freq: Four times a day (QID) | INTRAMUSCULAR | Status: DC | PRN

## 2018-12-08 MED ORDER — ACETAMINOPHEN 325 MG PO TABS
650.0000 mg | ORAL_TABLET | ORAL | Status: DC | PRN
  Administered 2018-12-09: 03:00:00 650 mg via ORAL
  Filled 2018-12-08: qty 2

## 2018-12-08 MED ORDER — ONDANSETRON HCL 4 MG PO TABS: 4.0000 mg | ORAL_TABLET | ORAL | Status: DC | PRN

## 2018-12-08 NOTE — Anesthesia Procedure Notes (Signed)
Epidural Patient location during procedure: OB Start time: 12/08/2018 7:40 AM End time: 12/08/2018 7:50 AM  Staffing Anesthesiologist: Pervis Hocking, DO Performed: anesthesiologist   Preanesthetic Checklist Completed: patient identified, pre-op evaluation, timeout performed, IV checked, risks and benefits discussed and monitors and equipment checked  Epidural Patient position: sitting Prep: site prepped and draped and DuraPrep Patient monitoring: continuous pulse ox, blood pressure, heart rate and cardiac monitor Approach: midline Location: L3-L4 Injection technique: LOR air  Needle:  Needle type: Tuohy  Needle gauge: 17 G Needle length: 9 cm Needle insertion depth: 4.5 cm Catheter type: closed end flexible Catheter size: 19 Gauge Catheter at skin depth: 10 cm Test dose: negative  Assessment Sensory level: T8 Events: blood not aspirated, injection not painful, no injection resistance, negative IV test and no paresthesia  Additional Notes Patient identified. Risks/Benefits/Options discussed with patient including but not limited to bleeding, infection, nerve damage, paralysis, failed block, incomplete pain control, headache, blood pressure changes, nausea, vomiting, reactions to medication both or allergic, itching and postpartum back pain. Confirmed with bedside nurse the patient's most recent platelet count. Confirmed with patient that they are not currently taking any anticoagulation, have any bleeding history or any family history of bleeding disorders. Patient expressed understanding and wished to proceed. All questions were answered. Sterile technique was used throughout the entire procedure. Please see nursing notes for vital signs. Test dose was given through epidural catheter and negative prior to continuing to dose epidural or start infusion. Warning signs of high block given to the patient including shortness of breath, tingling/numbness in hands, complete motor  block, or any concerning symptoms with instructions to call for help. Patient was given instructions on fall risk and not to get out of bed. All questions and concerns addressed with instructions to call with any issues or inadequate analgesia.  Reason for block:procedure for pain

## 2018-12-08 NOTE — Progress Notes (Signed)
Patient ID: Anna Long, female   DOB: [birth year], 33 y.o.   MRN: [id number] Pt doing well but has decided to stay tonight  afeb VSS Fundus firm and bleeding minimal  D/c home in AM  Will see pt in f/u visit in 2-3 weeks

## 2018-12-08 NOTE — Progress Notes (Signed)
Patient ID: Anna Long, female   DOB: 1985-06-09, 33 y.o.   MRN: [id number] Pt got uncomfortable on pitocin and received epidural  afeb VSS  Cervix 80/1/-2 Some light bleeding noted  AROM blood-tinged fluid  D/w pt and husband plan and she would like to avoid foley bulb.  Since now dilated we will AROM and place IUPC to adjust pitocin and follow progress. They have decided not to have family present at delivery.

## 2018-12-08 NOTE — Anesthesia Preprocedure Evaluation (Signed)
Anesthesia Evaluation  Patient identified by MRN, date of birth, ID band Patient awake    Reviewed: Allergy & Precautions, NPO status , Patient's Chart, lab work & pertinent test results  Airway Mallampati: II  TM Distance: >3 FB Neck ROM: Full    Dental no notable dental hx. (+) Dental Advisory Given, Teeth Intact   Pulmonary neg pulmonary ROS,    Pulmonary exam normal breath sounds clear to auscultation       Cardiovascular Exercise Tolerance: Good negative cardio ROS Normal cardiovascular exam Rhythm:Regular Rate:Normal     Neuro/Psych PSYCHIATRIC DISORDERS Anxiety negative neurological ROS     GI/Hepatic Neg liver ROS, GERD  Controlled,  Endo/Other  Hypothyroidism   Renal/GU negative Renal ROS  negative genitourinary   Musculoskeletal negative musculoskeletal ROS (+)   Abdominal   Peds negative pediatric ROS (+)  Hematology negative hematology ROS (+)   Anesthesia Other Findings   Reproductive/Obstetrics (+) Pregnancy IUFD 29 wks, trisomy 18                             Anesthesia Physical  Anesthesia Plan  ASA: II and emergent  Anesthesia Plan: Epidural   Post-op Pain Management:    Induction:   PONV Risk Score and Plan: 2  Airway Management Planned: Natural Airway  Additional Equipment: None  Intra-op Plan:   Post-operative Plan:   Informed Consent: I have reviewed the patients History and Physical, chart, labs and discussed the procedure including the risks, benefits and alternatives for the proposed anesthesia with the patient or authorized representative who has indicated his/her understanding and acceptance.       Plan Discussed with:   Anesthesia Plan Comments:         Anesthesia Quick Evaluation

## 2018-12-08 NOTE — Anesthesia Postprocedure Evaluation (Signed)
Anesthesia Post Note  Patient: Anna Long  Procedure(s) Performed: AN AD [place]     Patient location during evaluation: Mother Baby Anesthesia Type: Epidural Level of consciousness: awake Pain management: satisfactory to patient Vital Signs Assessment: post-procedure vital signs reviewed and stable Respiratory status: spontaneous breathing Cardiovascular status: stable Anesthetic complications: no    Last Vitals:  Vitals:   12/08/18 1445 12/08/18 1500  BP: 129/85 117/76  Pulse: 85 84  Resp:    Temp:    SpO2:      Last Pain:  Vitals:   12/08/18 1230  TempSrc: Oral  PainSc: 0-No pain   Pain Goal:                   [employer]

## 2018-12-09 MED ORDER — IBUPROFEN 600 MG PO TABS: 600.0000 mg | ORAL_TABLET | Freq: Four times a day (QID) | ORAL | 0 refills | Status: DC

## 2018-12-09 NOTE — Progress Notes (Signed)
Discharge instructions given to patient. Discussed motrin admin dosing and timing.  Follow up appointment time reviewed with patient.  Comfort box, molds and flowers given to patient.

## 2018-12-09 NOTE — Discharge Instructions (Signed)
As per discharge pamphlet °

## 2018-12-09 NOTE — Progress Notes (Signed)
PPD #1 No problems, bleeding has mostly stopped, some cramps Afeb, VSS D/c home

## 2018-12-09 NOTE — Discharge Summary (Signed)
OB Discharge Summary     Patient Name: Anna Long DOB: 1985/06/21 MRN: [id number]  Date of admission: 12/08/2018 Delivering MD: Huel Cote   Date of discharge: 12/09/2018  Admitting diagnosis: Pregnancy  Intrauterine pregnancy: [redacted]w[redacted]d     Secondary diagnosis:  Active Problems:   IUFD at 20 weeks or more of gestation      Discharge diagnosis: IUFD at 66 weeks, fetal Trisomy 65                                   Hospital course:  Induction of Labor With Vaginal Delivery   33 y.o. yo [id number] at [redacted]w[redacted]d was admitted to the hospital 12/08/2018 for induction of labor.  Indication for induction: IUFD, previous LTCS.  Patient had an uncomplicated labor course as follows: Membrane Rupture Time/Date: 8:15 AM ,12/08/2018   Intrapartum Procedures: Episiotomy: None [1]                                         Lacerations:  None [1]  Patient had delivery of a Non Viable infant.  Information for the patient's newborn:  Tylee, Yum [[id number]]  Delivery Method: VBAC, Spontaneous(Filed from Delivery Summary)    12/08/2018  Details of delivery can be found in separate delivery note.  Patient had a routine postpartum course. Patient is discharged home 12/09/18.  Physical exam  Vitals:   12/08/18 1659 12/09/18 0035 12/09/18 0555 12/09/18 0738  BP: 119/86 110/70 119/76 126/87  Pulse: 98 73 80 94  Resp: 16 16 16 16   Temp: 98.3 F (36.8 C) 98.4 F (36.9 C) 98.4 F (36.9 C) 98.1 F (36.7 C)  TempSrc: Oral Oral Oral Oral  SpO2: 100% 97% 97% 99%  Weight:      Height:       General: alert Lochia: appropriate Uterine Fundus: firm  Labs: Lab Results  Component Value Date   WBC 9.0 12/08/2018   HGB 13.1 12/08/2018   HCT 38.2 12/08/2018   MCV 92.5 12/08/2018   PLT 290 12/08/2018   No flowsheet data found.  Discharge instruction: per After Visit Summary and "Baby and Me Booklet".  After visit meds:  Allergies as of 12/09/2018      Reactions   Zithromax [azithromycin]  Other (See Comments)   Was told not to take this medication by PCP      Medication List    STOP taking these medications   famotidine 10 MG tablet Commonly known as: PEPCID     TAKE these medications   ibuprofen 600 MG tablet Commonly known as: ADVIL Take 1 tablet (600 mg total) by mouth every 6 (six) hours as needed. What changed: Another medication with the same name was added. Make sure you understand how and when to take each.   ibuprofen 600 MG tablet Commonly known as: ADVIL Take 1 tablet (600 mg total) by mouth every 6 (six) hours. What changed: You were already taking a medication with the same name, and this prescription was added. Make sure you understand how and when to take each.   levothyroxine 75 MCG tablet Commonly known as: SYNTHROID Take 75 mcg by mouth daily before breakfast.       Diet: routine diet  Activity: Advance as tolerated. Pelvic rest for 6 weeks.   Newborn Data: Birth Weight: 1 lb  11.9 oz (790 g) APGAR: 0, 0  Newborn Delivery   Birth date/time: 12/08/2018 13:27:00 Delivery type: VBAC, Spontaneous      Disposition:morgue   12/09/2018 Clarene Duke, MD

## 2019-06-21 LAB — HEPATITIS C ANTIBODY: HCV Ab: NEGATIVE

## 2019-06-21 LAB — OB RESULTS CONSOLE HEPATITIS B SURFACE ANTIGEN: Hepatitis B Surface Ag: NEGATIVE

## 2019-06-21 LAB — OB RESULTS CONSOLE RUBELLA ANTIBODY, IGM: Rubella: IMMUNE

## 2019-06-21 LAB — OB RESULTS CONSOLE HIV ANTIBODY (ROUTINE TESTING): HIV: NONREACTIVE

## 2020-01-04 LAB — OB RESULTS CONSOLE GBS: GBS: POSITIVE

## 2020-01-05 ENCOUNTER — Encounter (HOSPITAL_COMMUNITY): Payer: Self-pay | Admitting: *Deleted

## 2020-01-05 ENCOUNTER — Telehealth (HOSPITAL_COMMUNITY): Payer: Self-pay | Admitting: *Deleted

## 2020-01-05 NOTE — Patient Instructions (Signed)
Rashon Rezek  01/05/2020   Your procedure is scheduled on:  01/19/2020  Arrive at 0530 at Entrance C on [address] at [facility]  and [employer]. You are invited to use the FREE valet parking or use the Visitor's parking deck.  Pick up the phone at the desk and dial [phone].  Call this number if you have problems the morning of surgery: [phone]  Remember:   Do not eat food:(After Midnight) Desps de medianoche.  Do not drink clear liquids: (After Midnight) Desps de medianoche.  Take these medicines the morning of surgery with A SIP OF WATER:  levothyroxine   Do not wear jewelry, make-up or nail polish.  Do not wear lotions, powders, or perfumes. Do not wear deodorant.  Do not shave 48 hours prior to surgery.  Do not bring valuables to the hospital.  [facility] is not   responsible for any belongings or valuables brought to the hospital.  Contacts, dentures or bridgework may not be worn into surgery.  Leave suitcase in the car. After surgery it may be brought to your room.  For patients admitted to the hospital, checkout time is 11:00 AM the day of              discharge.      Please read over the following fact sheets that you were given:     Preparing for Surgery

## 2020-01-05 NOTE — Telephone Encounter (Signed)
Preadmission screen  

## 2020-01-08 ENCOUNTER — Other Ambulatory Visit: Payer: Self-pay | Admitting: Obstetrics and Gynecology

## 2020-01-08 DIAGNOSIS — O368390 Maternal care for abnormalities of the fetal heart rate or rhythm, unspecified trimester, not applicable or unspecified: Secondary | ICD-10-CM

## 2020-01-08 DIAGNOSIS — Z363 Encounter for antenatal screening for malformations: Secondary | ICD-10-CM

## 2020-01-08 DIAGNOSIS — Z3A37 37 weeks gestation of pregnancy: Secondary | ICD-10-CM

## 2020-01-09 ENCOUNTER — Ambulatory Visit (HOSPITAL_BASED_OUTPATIENT_CLINIC_OR_DEPARTMENT_OTHER): Payer: Self-pay | Admitting: *Deleted

## 2020-01-09 ENCOUNTER — Ambulatory Visit (HOSPITAL_BASED_OUTPATIENT_CLINIC_OR_DEPARTMENT_OTHER): Payer: Self-pay | Admitting: Obstetrics

## 2020-01-09 ENCOUNTER — Encounter
Admission: RE | Admit: 2020-01-09 | Discharge: 2020-01-09 | Disposition: A | Discharge status: Home / Self Care | Payer: Self-pay | Source: Ambulatory Visit | Attending: Obstetrics and Gynecology | Admitting: Obstetrics and Gynecology

## 2020-01-09 ENCOUNTER — Other Ambulatory Visit: Payer: Self-pay

## 2020-01-09 ENCOUNTER — Other Ambulatory Visit (HOSPITAL_COMMUNITY)
Admission: RE | Admit: 2020-01-09 | Discharge: 2020-01-09 | Disposition: A | Discharge status: Home / Self Care | Payer: Self-pay | Source: Ambulatory Visit | Attending: Obstetrics and Gynecology | Admitting: Obstetrics and Gynecology

## 2020-01-09 ENCOUNTER — Ambulatory Visit: Payer: Self-pay | Admitting: *Deleted

## 2020-01-09 ENCOUNTER — Encounter (HOSPITAL_COMMUNITY): Payer: Self-pay

## 2020-01-09 ENCOUNTER — Encounter: Payer: Self-pay | Admitting: *Deleted

## 2020-01-09 ENCOUNTER — Ambulatory Visit (HOSPITAL_BASED_OUTPATIENT_CLINIC_OR_DEPARTMENT_OTHER): Payer: Self-pay

## 2020-01-09 VITALS — BP 146/83 | HR 79

## 2020-01-09 DIAGNOSIS — Z3A37 37 weeks gestation of pregnancy: Secondary | ICD-10-CM | POA: Insufficient documentation

## 2020-01-09 DIAGNOSIS — O09523 Supervision of elderly multigravida, third trimester: Secondary | ICD-10-CM

## 2020-01-09 DIAGNOSIS — Z20822 Contact with and (suspected) exposure to covid-19: Secondary | ICD-10-CM | POA: Insufficient documentation

## 2020-01-09 DIAGNOSIS — O368330 Maternal care for abnormalities of the fetal heart rate or rhythm, third trimester, not applicable or unspecified: Secondary | ICD-10-CM | POA: Insufficient documentation

## 2020-01-09 DIAGNOSIS — Z363 Encounter for antenatal screening for malformations: Secondary | ICD-10-CM | POA: Insufficient documentation

## 2020-01-09 DIAGNOSIS — O368390 Maternal care for abnormalities of the fetal heart rate or rhythm, unspecified trimester, not applicable or unspecified: Secondary | ICD-10-CM

## 2020-01-09 DIAGNOSIS — E039 Hypothyroidism, unspecified: Secondary | ICD-10-CM

## 2020-01-09 LAB — CBC
HCT: 33.9 % — ABNORMAL LOW (ref 36.0–46.0)
Hemoglobin: 11.4 g/dL — ABNORMAL LOW (ref 12.0–15.0)
MCH: 30.6 pg (ref 26.0–34.0)
MCHC: 33.6 g/dL (ref 30.0–36.0)
MCV: 91.1 fL (ref 80.0–100.0)
Platelets: 221 10*3/uL (ref 150–400)
RBC: 3.72 MIL/uL — ABNORMAL LOW (ref 3.87–5.11)
RDW: 14 % (ref 11.5–15.5)
WBC: 6.3 10*3/uL (ref 4.0–10.5)
nRBC: 0 % (ref 0.0–0.2)

## 2020-01-09 LAB — TYPE AND SCREEN
ABO/RH(D): O POS
Antibody Screen: NEGATIVE

## 2020-01-09 LAB — SARS CORONAVIRUS 2 (TAT 6-24 HRS): SARS Coronavirus 2: NEGATIVE

## 2020-01-09 NOTE — Procedures (Signed)
Anna Long 07-03-1985 [redacted]w[redacted]d  Fetus A Non-Stress Test Interpretation for 01/09/20  Indication: Fetal arrhythmia  Fetal Heart Rate A Mode: External Baseline Rate (A):  (Unable to document) Accelerations:  (See comments)  Uterine Activity Mode: Palpation, Toco Contraction Frequency (min): 2-5 Contraction Duration (sec): 40-70 Contraction Quality: Mild Resting Tone Palpated: Relaxed Resting Time: Adequate  Interpretation (Fetal Testing) Comments: Fetus is active. There appears to be accelerations. Audibly irreg FHR. Breaks in tracing due to fetal arrhythmia. Dr. Parke Poisson reviewed tracing.

## 2020-01-09 NOTE — Progress Notes (Signed)
MFM Consult Note  This patient was seen for a detailed fetal anatomy scan as a fetal arrhythmia was noted starting about 1 week ago.  The patient reports that she was diagnosed with Covid-19 at around 24 weeks of her current pregnancy.  She reports a history of hypothyroidism that is currently treated with levothyroxine.  She denies any other significant past medical history.  Her last pregnancy was complicated by a fetal demise at around 29 weeks.  That fetus was known to have trisomy 94.  The patient reports feeling vigorous fetal movements throughout the day. She denies excessive caffeine use.   Her blood pressures in our office today were mildly elevated 130/92 and 146/83.   She was informed that the fetal growth and amniotic fluid level were appropriate for her gestational age.   The views of the fetal anatomy were limited today due to the fetal position and her advanced gestational age.   There were no obvious cardiac anomalies noted.  The patient was informed that anomalies may be missed due to technical limitations. If the fetus is in a suboptimal position or maternal habitus is increased, visualization of the fetus in the maternal uterus may be impaired.  On today's exam, the majority of the fetal heart rate was regular.  An occasional dropped beat was noted.  The patient was advised that the occasional dropped beat is most likely due to premature atrial contractions (PACs).  She was advised that PACs are usually benign and will be present on one visit and may not be present on the next visit.  PACs generally will resolve after birth.  As there were no signs of hydrops noted today, she was reassured that her baby most likely has not been affected by the arrhythmia noted today.  Although rare, PACs occasionally may progress to supraventricular tachycardia with a fetal heart rate greater than 200.   The patient had a nonstress test performed in our office following her ultrasound exam today.   The NST showed many dropped beats.  It would most likely have been a reactive tracing had the dropped beats not been present.  Due to her history of a prior IUFD, the fetal arrhythmia noted today, and her mildly elevated blood pressures, I would recommend delivery sometime this week to decrease the risk of another adverse pregnancy outcome.  Her baby should be examined after birth to determine if the fetal arrhythmia is present and to determine if a referral to pediatric cardiology is necessary.  The patient and her husband were happy with the plan of delivery sometime this week.  A total of 30 minutes was spent counseling and coordinating the care for this patient.

## 2020-01-09 NOTE — H&P (Signed)
Anna Long is a 34 y.o. female [id number] at 62 5/7 weeks (EDD 01/26/20 by LMP c/w 6 week [place]) presenting for scheduled repeat c-section due to a persistent fetal arrhythmia and MFM recommendation to proceed with delivery. Patient has a prior h/o c-section and declines trial of labor, as well as a prior fetal demise at 29 weeks due to trisomy 71.  Prenatal care also significant for advanced maternal age with a normal NIPT and hypothyroidsim followed and levels appropriate at 28 weeks on of levothyroxine.  She had COVID at [redacted] weeks gestation and recovered without needing hospitalization.  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  OB History    Gravida  5   Para  2   Term  1   Preterm  1   AB  2   Living  1     SAB      TAB      Ectopic      Multiple  0   Live Births            02-02-2013, 5 wks SAB 12-14-2013  LTCS 8lbs 06-06-2017 SAB 12-08-2018, 29 wks fetal demise  1. M, 1lbs 11oz, Vaginal Delivery  Past Medical History:  Diagnosis Date  . Anxiety    no meds currently  . GERD (gastroesophageal reflux disease)    no meds, diet controlled  . Hypothyroidism    Past Surgical History:  Procedure Laterality Date  . CESAREAN SECTION     x 1 in [facility]  . DILATION AND EVACUATION N/A 06/16/2017   Procedure: DILATATION AND EVACUATION;  Surgeon: Huel Cote, MD;  Location: [facility];  Service: Gynecology;  Laterality: N/A;  . WISDOM TOOTH EXTRACTION     Family History: family history includes Anxiety disorder in her mother; Arthritis in her paternal grandmother; Depression in her mother; Diabetes in her maternal grandfather. Social History:  reports that she has never smoked. She has never used smokeless tobacco. She reports previous alcohol use. She reports that she does not use drugs.     Maternal Diabetes: No Genetic Screening: Normal Maternal Ultrasounds/Referrals: Normal Fetal Ultrasounds or other Referrals:  Referred to Materal Fetal Medicine  Maternal Substance  Abuse:  No Significant Maternal Medications:  Meds include: Syntroid Significant Maternal Lab Results:  Group B Strep positive Other Comments:  None  Review of Systems  Constitutional: Negative for fever.  Cardiovascular: Negative for palpitations.  Gastrointestinal: Negative for abdominal pain.   Maternal Medical History:  Contractions: Frequency: irregular.   Perceived severity is mild.    Fetal activity: Perceived fetal activity is normal.    Prenatal complications: AMA, hypothyroidism, fetal arrhythmia, GBS positive, prior trisomy 18 fetal demise at 29 weeks  Prenatal Complications - Diabetes: none.      Last menstrual period 04/21/2019. Maternal Exam:  Uterine Assessment: Contraction strength is mild.  Contraction frequency is irregular.   Abdomen: Patient reports no abdominal tenderness. Surgical scars: low transverse.   Fetal presentation: vertex  Introitus: Normal vulva. Normal vagina.    Physical Exam Cardiovascular:     Rate and Rhythm: Normal rate and regular rhythm.  Pulmonary:     Effort: Pulmonary effort is normal.  Abdominal:     Palpations: Abdomen is soft.  Genitourinary:    General: Normal vulva.  Neurological:     Mental Status: She is alert.  Psychiatric:        Mood and Affect: Mood normal.     Prenatal labs: ABO, Rh: --/--/O POS (12/07 1156) Antibody:  NEG (12/07 1156) Rubella:  Immune RPR:   NR HBsAg:   Neg HIV:   NR GBS:   positive Hgb AA NIPT low risk One hour GCT 114    Assessment/Plan: The pt was counseled by Dr. Reina Fuse and me on risks and benefits of repeat c-section including bleeding, infection, and possible damage to bowel and bladder.  She would take a blood transfusion if needed.  Her prior c-section incision is low, right on the pubis, so she was counseled we will go above that.  We discussed NICU will assess the baby and the arrhythmia after birth and that there is the possibility of needed NICU support for the baby  given early term and the arrhythmia.  She is considering a tubal sterilization and will let Dr. Reina Fuse know if she wishes to proceed with that at time of c-section tomorrow.   Oliver Pila 01/09/2020, 1:32 PM

## 2020-01-09 NOTE — Patient Instructions (Signed)
Kendahl Bumgardner  01/09/2020   Your procedure is scheduled on:  01/10/2020  Arrive at 1430 at Entrance C on [address] at [facility]  and [employer]. You are invited to use the FREE valet parking or use the Visitor's parking deck.  Pick up the phone at the desk and dial [phone].  Call this number if you have problems the morning of surgery: [phone]  Remember:   Do not eat food:(After Midnight) Desps de medianoche.  Do not drink clear liquids: (6 Hours before arrival) 6 horas ante llegada.  Take these medicines the morning of surgery with A SIP OF WATER:  Take levothyroxine as prescribed   Do not wear jewelry, make-up or nail polish.  Do not wear lotions, powders, or perfumes. Do not wear deodorant.  Do not shave 48 hours prior to surgery.  Do not bring valuables to the hospital.  [facility] is not   responsible for any belongings or valuables brought to the hospital.  Contacts, dentures or bridgework may not be worn into surgery.  Leave suitcase in the car. After surgery it may be brought to your room.  For patients admitted to the hospital, checkout time is 11:00 AM the day of              discharge.      Please read over the following fact sheets that you were given:     Preparing for Surgery

## 2020-01-10 ENCOUNTER — Encounter (HOSPITAL_COMMUNITY)
Admission: AD | Disposition: A | Discharge status: Home / Self Care | Payer: Self-pay | Source: Home / Self Care | Attending: Obstetrics and Gynecology

## 2020-01-10 ENCOUNTER — Inpatient Hospital Stay (HOSPITAL_COMMUNITY)
Admission: AD | Admit: 2020-01-10 | Discharge: 2020-01-12 | DRG: 788 | Disposition: A | Discharge status: Home / Self Care | Payer: Self-pay | Attending: Obstetrics and Gynecology | Admitting: Obstetrics and Gynecology

## 2020-01-10 ENCOUNTER — Inpatient Hospital Stay (HOSPITAL_COMMUNITY): Payer: Self-pay | Admitting: Certified Registered Nurse Anesthetist

## 2020-01-10 ENCOUNTER — Other Ambulatory Visit: Payer: Self-pay

## 2020-01-10 ENCOUNTER — Encounter (HOSPITAL_COMMUNITY): Payer: Self-pay | Admitting: Obstetrics and Gynecology

## 2020-01-10 DIAGNOSIS — Z98891 History of uterine scar from previous surgery: Secondary | ICD-10-CM

## 2020-01-10 DIAGNOSIS — E039 Hypothyroidism, unspecified: Secondary | ICD-10-CM | POA: Diagnosis present

## 2020-01-10 DIAGNOSIS — O99824 Streptococcus B carrier state complicating childbirth: Secondary | ICD-10-CM | POA: Diagnosis present

## 2020-01-10 DIAGNOSIS — O99284 Endocrine, nutritional and metabolic diseases complicating childbirth: Secondary | ICD-10-CM | POA: Diagnosis present

## 2020-01-10 DIAGNOSIS — O34211 Maternal care for low transverse scar from previous cesarean delivery: Principal | ICD-10-CM | POA: Diagnosis present

## 2020-01-10 DIAGNOSIS — Z3A37 37 weeks gestation of pregnancy: Secondary | ICD-10-CM

## 2020-01-10 DIAGNOSIS — Z8616 Personal history of COVID-19: Secondary | ICD-10-CM

## 2020-01-10 LAB — RPR: RPR Ser Ql: NONREACTIVE

## 2020-01-10 SURGERY — Surgical Case
Anesthesia: Spinal

## 2020-01-10 MED ORDER — OXYTOCIN-SODIUM CHLORIDE 30-0.9 UT/500ML-% IV SOLN: INTRAVENOUS | Status: DC | PRN

## 2020-01-10 MED ORDER — SIMETHICONE 80 MG PO CHEW: 80.0000 mg | CHEWABLE_TABLET | ORAL | Status: DC | PRN

## 2020-01-10 MED ORDER — NALOXONE HCL 0.4 MG/ML IJ SOLN: 0.4000 mg | INTRAMUSCULAR | Status: DC | PRN

## 2020-01-10 MED ORDER — MORPHINE SULFATE (PF) 0.5 MG/ML IJ SOLN
INTRAMUSCULAR | Status: DC | PRN
  Administered 2020-01-10: 150 ug via INTRATHECAL

## 2020-01-10 MED ORDER — DIPHENHYDRAMINE HCL 50 MG/ML IJ SOLN
12.5000 mg | INTRAMUSCULAR | Status: DC | PRN
  Administered 2020-01-10: 12.5 mg via INTRAVENOUS
  Filled 2020-01-10: qty 1

## 2020-01-10 MED ORDER — ONDANSETRON HCL 4 MG/2ML IJ SOLN
INTRAMUSCULAR | Status: DC | PRN
  Administered 2020-01-10: 4 mg via INTRAVENOUS

## 2020-01-10 MED ORDER — PHENYLEPHRINE 40 MCG/ML (10ML) SYRINGE FOR IV PUSH (FOR BLOOD PRESSURE SUPPORT)
PREFILLED_SYRINGE | INTRAVENOUS | Status: AC
  Filled 2020-01-10: qty 10

## 2020-01-10 MED ORDER — MORPHINE SULFATE (PF) 0.5 MG/ML IJ SOLN
INTRAMUSCULAR | Status: AC
  Filled 2020-01-10: qty 10

## 2020-01-10 MED ORDER — DIPHENHYDRAMINE HCL 25 MG PO CAPS: 25.0000 mg | ORAL_CAPSULE | Freq: Four times a day (QID) | ORAL | Status: DC | PRN

## 2020-01-10 MED ORDER — KETOROLAC TROMETHAMINE 30 MG/ML IJ SOLN: 30.0000 mg | Freq: Four times a day (QID) | INTRAMUSCULAR | Status: AC | PRN

## 2020-01-10 MED ORDER — STERILE WATER FOR IRRIGATION IR SOLN
Status: DC | PRN
  Administered 2020-01-10: 1000 mL

## 2020-01-10 MED ORDER — TETANUS-DIPHTH-ACELL PERTUSSIS 5-2.5-18.5 LF-MCG/0.5 IM SUSY: 0.5000 mL | PREFILLED_SYRINGE | Freq: Once | INTRAMUSCULAR | Status: DC

## 2020-01-10 MED ORDER — NALOXONE HCL 4 MG/10ML IJ SOLN
1.0000 ug/kg/h | INTRAVENOUS | Status: DC | PRN
  Filled 2020-01-10: qty 5

## 2020-01-10 MED ORDER — OXYCODONE HCL 5 MG/5ML PO SOLN: 5.0000 mg | Freq: Once | ORAL | Status: DC | PRN

## 2020-01-10 MED ORDER — FENTANYL CITRATE (PF) 100 MCG/2ML IJ SOLN: 25.0000 ug | INTRAMUSCULAR | Status: DC | PRN

## 2020-01-10 MED ORDER — SODIUM CHLORIDE 0.9 % IR SOLN
Status: DC | PRN
  Administered 2020-01-10: 1

## 2020-01-10 MED ORDER — DEXAMETHASONE SODIUM PHOSPHATE 4 MG/ML IJ SOLN
INTRAMUSCULAR | Status: DC | PRN
  Administered 2020-01-10: 4 mg via INTRAVENOUS

## 2020-01-10 MED ORDER — SIMETHICONE 80 MG PO CHEW
80.0000 mg | CHEWABLE_TABLET | ORAL | Status: DC
  Administered 2020-01-11 (×2): 80 mg via ORAL
  Filled 2020-01-10 (×2): qty 1

## 2020-01-10 MED ORDER — PHENYLEPHRINE HCL (PRESSORS) 10 MG/ML IV SOLN
INTRAVENOUS | Status: DC | PRN
  Administered 2020-01-10: 80 ug via INTRAVENOUS
  Administered 2020-01-10: 40 ug via INTRAVENOUS

## 2020-01-10 MED ORDER — ZOLPIDEM TARTRATE 5 MG PO TABS: 5.0000 mg | ORAL_TABLET | Freq: Every evening | ORAL | Status: DC | PRN

## 2020-01-10 MED ORDER — KETOROLAC TROMETHAMINE 30 MG/ML IJ SOLN
30.0000 mg | Freq: Four times a day (QID) | INTRAMUSCULAR | Status: AC | PRN
  Administered 2020-01-10: 30 mg via INTRAMUSCULAR

## 2020-01-10 MED ORDER — WITCH HAZEL-GLYCERIN EX PADS: 1.0000 "application " | MEDICATED_PAD | CUTANEOUS | Status: DC | PRN

## 2020-01-10 MED ORDER — OXYTOCIN-SODIUM CHLORIDE 30-0.9 UT/500ML-% IV SOLN
INTRAVENOUS | Status: DC | PRN
  Administered 2020-01-10 (×2): 200 mL via INTRAVENOUS

## 2020-01-10 MED ORDER — PHENYLEPHRINE HCL-NACL 20-0.9 MG/250ML-% IV SOLN
INTRAVENOUS | Status: DC | PRN
  Administered 2020-01-10: 60 ug/min via INTRAVENOUS

## 2020-01-10 MED ORDER — SENNOSIDES-DOCUSATE SODIUM 8.6-50 MG PO TABS
2.0000 | ORAL_TABLET | ORAL | Status: DC
  Administered 2020-01-11 (×2): 2 via ORAL
  Filled 2020-01-10 (×2): qty 2

## 2020-01-10 MED ORDER — NALBUPHINE HCL 10 MG/ML IJ SOLN: 5.0000 mg | Freq: Once | INTRAMUSCULAR | Status: AC | PRN

## 2020-01-10 MED ORDER — ACETAMINOPHEN 160 MG/5ML PO SOLN: 325.0000 mg | ORAL | Status: DC | PRN

## 2020-01-10 MED ORDER — ACETAMINOPHEN 10 MG/ML IV SOLN
INTRAVENOUS | Status: DC | PRN
  Administered 2020-01-10: 1000 mg via INTRAVENOUS

## 2020-01-10 MED ORDER — OXYCODONE HCL 5 MG PO TABS: 5.0000 mg | ORAL_TABLET | Freq: Once | ORAL | Status: DC | PRN

## 2020-01-10 MED ORDER — DEXAMETHASONE SODIUM PHOSPHATE 4 MG/ML IJ SOLN
INTRAMUSCULAR | Status: AC
  Filled 2020-01-10: qty 1

## 2020-01-10 MED ORDER — NALBUPHINE HCL 10 MG/ML IJ SOLN
5.0000 mg | INTRAMUSCULAR | Status: DC | PRN
  Administered 2020-01-11 (×2): 5 mg via INTRAVENOUS
  Filled 2020-01-10 (×2): qty 1

## 2020-01-10 MED ORDER — DIBUCAINE (PERIANAL) 1 % EX OINT: 1.0000 "application " | TOPICAL_OINTMENT | CUTANEOUS | Status: DC | PRN

## 2020-01-10 MED ORDER — ONDANSETRON HCL 4 MG/2ML IJ SOLN: 4.0000 mg | Freq: Three times a day (TID) | INTRAMUSCULAR | Status: DC | PRN

## 2020-01-10 MED ORDER — SIMETHICONE 80 MG PO CHEW
80.0000 mg | CHEWABLE_TABLET | Freq: Three times a day (TID) | ORAL | Status: DC
  Administered 2020-01-11 (×3): 80 mg via ORAL
  Filled 2020-01-10 (×4): qty 1

## 2020-01-10 MED ORDER — IBUPROFEN 800 MG PO TABS
800.0000 mg | ORAL_TABLET | Freq: Four times a day (QID) | ORAL | Status: DC
  Administered 2020-01-11 – 2020-01-12 (×5): 800 mg via ORAL
  Filled 2020-01-10 (×5): qty 1

## 2020-01-10 MED ORDER — PHENYLEPHRINE HCL-NACL 20-0.9 MG/250ML-% IV SOLN: INTRAVENOUS | Status: DC | PRN

## 2020-01-10 MED ORDER — ONDANSETRON HCL 4 MG/2ML IJ SOLN
INTRAMUSCULAR | Status: AC
  Filled 2020-01-10: qty 2

## 2020-01-10 MED ORDER — FENTANYL CITRATE (PF) 100 MCG/2ML IJ SOLN
INTRAMUSCULAR | Status: AC
  Filled 2020-01-10: qty 2

## 2020-01-10 MED ORDER — NALBUPHINE HCL 10 MG/ML IJ SOLN
5.0000 mg | Freq: Once | INTRAMUSCULAR | Status: AC | PRN
  Administered 2020-01-10: 5 mg via INTRAVENOUS

## 2020-01-10 MED ORDER — DIPHENHYDRAMINE HCL 25 MG PO CAPS
25.0000 mg | ORAL_CAPSULE | ORAL | Status: DC | PRN
  Administered 2020-01-11: 25 mg via ORAL
  Filled 2020-01-10: qty 1

## 2020-01-10 MED ORDER — ACETAMINOPHEN 500 MG PO TABS
1000.0000 mg | ORAL_TABLET | Freq: Four times a day (QID) | ORAL | Status: DC
  Administered 2020-01-11 – 2020-01-12 (×6): 1000 mg via ORAL
  Filled 2020-01-10 (×7): qty 2

## 2020-01-10 MED ORDER — MEPERIDINE HCL 25 MG/ML IJ SOLN: 6.2500 mg | INTRAMUSCULAR | Status: DC | PRN

## 2020-01-10 MED ORDER — OXYCODONE HCL 5 MG PO TABS
5.0000 mg | ORAL_TABLET | ORAL | Status: DC | PRN
  Administered 2020-01-11 – 2020-01-12 (×2): 5 mg via ORAL
  Filled 2020-01-10 (×2): qty 1

## 2020-01-10 MED ORDER — OXYTOCIN-SODIUM CHLORIDE 30-0.9 UT/500ML-% IV SOLN
INTRAVENOUS | Status: AC
  Filled 2020-01-10: qty 500

## 2020-01-10 MED ORDER — SCOPOLAMINE 1 MG/3DAYS TD PT72
1.0000 | MEDICATED_PATCH | Freq: Once | TRANSDERMAL | Status: DC
  Administered 2020-01-10: 1.5 mg via TRANSDERMAL

## 2020-01-10 MED ORDER — LACTATED RINGERS IV SOLN: INTRAVENOUS | Status: DC

## 2020-01-10 MED ORDER — KETOROLAC TROMETHAMINE 30 MG/ML IJ SOLN
30.0000 mg | Freq: Four times a day (QID) | INTRAMUSCULAR | Status: AC
  Administered 2020-01-11 (×2): 30 mg via INTRAVENOUS
  Filled 2020-01-10 (×2): qty 1

## 2020-01-10 MED ORDER — ONDANSETRON HCL 4 MG/2ML IJ SOLN: 4.0000 mg | Freq: Once | INTRAMUSCULAR | Status: DC | PRN

## 2020-01-10 MED ORDER — POVIDONE-IODINE 10 % EX SWAB
2.0000 "application " | Freq: Once | CUTANEOUS | Status: AC
  Administered 2020-01-10: 2 via TOPICAL

## 2020-01-10 MED ORDER — ERYTHROMYCIN 5 MG/GM OP OINT
TOPICAL_OINTMENT | OPHTHALMIC | Status: AC
  Filled 2020-01-10: qty 1

## 2020-01-10 MED ORDER — MENTHOL 3 MG MT LOZG: 1.0000 | LOZENGE | OROMUCOSAL | Status: DC | PRN

## 2020-01-10 MED ORDER — NALBUPHINE HCL 10 MG/ML IJ SOLN
INTRAMUSCULAR | Status: AC
  Filled 2020-01-10: qty 1

## 2020-01-10 MED ORDER — LEVOTHYROXINE SODIUM 100 MCG PO TABS
100.0000 ug | ORAL_TABLET | Freq: Every day | ORAL | Status: DC
  Administered 2020-01-11 – 2020-01-12 (×2): 100 ug via ORAL
  Filled 2020-01-10 (×2): qty 1

## 2020-01-10 MED ORDER — CEFAZOLIN SODIUM-DEXTROSE 2-4 GM/100ML-% IV SOLN
2.0000 g | INTRAVENOUS | Status: AC
  Administered 2020-01-10: 2 g via INTRAVENOUS

## 2020-01-10 MED ORDER — COCONUT OIL OIL: 1.0000 "application " | TOPICAL_OIL | Status: DC | PRN

## 2020-01-10 MED ORDER — CEFAZOLIN SODIUM-DEXTROSE 2-4 GM/100ML-% IV SOLN
INTRAVENOUS | Status: AC
  Filled 2020-01-10: qty 100

## 2020-01-10 MED ORDER — SODIUM CHLORIDE 0.9% FLUSH: 3.0000 mL | INTRAVENOUS | Status: DC | PRN

## 2020-01-10 MED ORDER — BUPIVACAINE IN DEXTROSE 0.75-8.25 % IT SOLN
INTRATHECAL | Status: DC | PRN
  Administered 2020-01-10: 1.55 mL via INTRATHECAL

## 2020-01-10 MED ORDER — ACETAMINOPHEN 325 MG PO TABS: 325.0000 mg | ORAL_TABLET | ORAL | Status: DC | PRN

## 2020-01-10 MED ORDER — FENTANYL CITRATE (PF) 100 MCG/2ML IJ SOLN
INTRAMUSCULAR | Status: DC | PRN
  Administered 2020-01-10: 15 ug via INTRATHECAL

## 2020-01-10 MED ORDER — SCOPOLAMINE 1 MG/3DAYS TD PT72
MEDICATED_PATCH | TRANSDERMAL | Status: AC
  Filled 2020-01-10: qty 1

## 2020-01-10 MED ORDER — PRENATAL MULTIVITAMIN CH
1.0000 | ORAL_TABLET | Freq: Every day | ORAL | Status: DC
  Administered 2020-01-11: 1 via ORAL
  Filled 2020-01-10: qty 1

## 2020-01-10 MED ORDER — OXYTOCIN-SODIUM CHLORIDE 30-0.9 UT/500ML-% IV SOLN
2.5000 [IU]/h | INTRAVENOUS | Status: AC
  Administered 2020-01-10: 2.5 [IU]/h via INTRAVENOUS

## 2020-01-10 MED ORDER — LACTATED RINGERS IV SOLN: INTRAVENOUS | Status: DC | PRN

## 2020-01-10 MED ORDER — ACETAMINOPHEN 10 MG/ML IV SOLN
INTRAVENOUS | Status: AC
  Filled 2020-01-10: qty 100

## 2020-01-10 MED ORDER — KETOROLAC TROMETHAMINE 30 MG/ML IJ SOLN
INTRAMUSCULAR | Status: AC
  Filled 2020-01-10: qty 1

## 2020-01-10 MED ORDER — NALBUPHINE HCL 10 MG/ML IJ SOLN: 5.0000 mg | INTRAMUSCULAR | Status: DC | PRN

## 2020-01-10 SURGICAL SUPPLY — 38 items
CHLORAPREP W/TINT 26ML (MISCELLANEOUS) ×3 IMPLANT
CLAMP CORD UMBIL (MISCELLANEOUS) IMPLANT
CLOTH BEACON ORANGE TIMEOUT ST (SAFETY) ×3 IMPLANT
DERMABOND ADHESIVE PROPEN (GAUZE/BANDAGES/DRESSINGS) ×2
DERMABOND ADVANCED (GAUZE/BANDAGES/DRESSINGS) ×2
DERMABOND ADVANCED .7 DNX12 (GAUZE/BANDAGES/DRESSINGS) ×1 IMPLANT
DERMABOND ADVANCED .7 DNX6 (GAUZE/BANDAGES/DRESSINGS) ×1 IMPLANT
DRSG OPSITE POSTOP 4X10 (GAUZE/BANDAGES/DRESSINGS) ×3 IMPLANT
ELECT REM PT RETURN 9FT ADLT (ELECTROSURGICAL) ×3
ELECTRODE REM PT RTRN 9FT ADLT (ELECTROSURGICAL) ×1 IMPLANT
EXTRACTOR VACUUM BELL STYLE (SUCTIONS) IMPLANT
GAUZE SPONGE 4X4 12PLY STRL LF (GAUZE/BANDAGES/DRESSINGS) IMPLANT
GLOVE BIO SURGEON STRL SZ 6 (GLOVE) ×3 IMPLANT
GLOVE BIOGEL PI IND STRL 6.5 (GLOVE) ×1 IMPLANT
GLOVE BIOGEL PI IND STRL 7.0 (GLOVE) ×2 IMPLANT
GLOVE BIOGEL PI INDICATOR 6.5 (GLOVE) ×2
GLOVE BIOGEL PI INDICATOR 7.0 (GLOVE) ×4
GOWN STRL REUS W/TWL LRG LVL3 (GOWN DISPOSABLE) ×6 IMPLANT
KIT ABG SYR 3ML LUER SLIP (SYRINGE) IMPLANT
NEEDLE HYPO 25X5/8 SAFETYGLIDE (NEEDLE) IMPLANT
NS IRRIG 1000ML POUR BTL (IV SOLUTION) ×3 IMPLANT
PACK C SECTION WH (CUSTOM PROCEDURE TRAY) ×3 IMPLANT
PAD ABD 8X10 STRL (GAUZE/BANDAGES/DRESSINGS) IMPLANT
PAD OB MATERNITY 4.3X12.25 (PERSONAL CARE ITEMS) ×3 IMPLANT
PENCIL SMOKE EVAC W/HOLSTER (ELECTROSURGICAL) ×3 IMPLANT
RTRCTR C-SECT PINK 25CM LRG (MISCELLANEOUS) IMPLANT
SUT PDS AB 0 CTX 36 PDP370T (SUTURE) IMPLANT
SUT PLAIN 2 0 (SUTURE)
SUT PLAIN ABS 2-0 CT1 27XMFL (SUTURE) IMPLANT
SUT VIC AB 0 CT1 36 (SUTURE) ×6 IMPLANT
SUT VIC AB 2-0 CT1 27 (SUTURE) ×2
SUT VIC AB 2-0 CT1 TAPERPNT 27 (SUTURE) ×1 IMPLANT
SUT VIC AB 3-0 SH 27 (SUTURE)
SUT VIC AB 3-0 SH 27X BRD (SUTURE) IMPLANT
SUT VIC AB 4-0 KS 27 (SUTURE) ×3 IMPLANT
TOWEL OR 17X24 6PK STRL BLUE (TOWEL DISPOSABLE) ×3 IMPLANT
TRAY FOLEY W/BAG SLVR 14FR LF (SET/KITS/TRAYS/PACK) ×3 IMPLANT
WATER STERILE IRR 1000ML POUR (IV SOLUTION) ×3 IMPLANT

## 2020-01-10 NOTE — Anesthesia Preprocedure Evaluation (Addendum)
Anesthesia Evaluation  Patient identified by MRN, date of birth, ID band Patient awake    Reviewed: Allergy & Precautions, NPO status , Patient's Chart, lab work & pertinent test results  Airway Mallampati: II  TM Distance: >3 FB Neck ROM: Full    Dental no notable dental hx. (+) Dental Advisory Given, Teeth Intact   Pulmonary neg pulmonary ROS,    Pulmonary exam normal breath sounds clear to auscultation       Cardiovascular Exercise Tolerance: Good negative cardio ROS Normal cardiovascular exam Rhythm:Regular Rate:Normal     Neuro/Psych PSYCHIATRIC DISORDERS Anxiety negative neurological ROS     GI/Hepatic Neg liver ROS, GERD  Controlled,  Endo/Other  Hypothyroidism   Renal/GU negative Renal ROS  negative genitourinary   Musculoskeletal negative musculoskeletal ROS (+)   Abdominal   Peds negative pediatric ROS (+)  Hematology negative hematology ROS (+)   Anesthesia Other Findings   Reproductive/Obstetrics (+) Pregnancy IUFD 29 wks, trisomy 18                             Anesthesia Physical  Anesthesia Plan  ASA: II  Anesthesia Plan: Spinal   Post-op Pain Management:    Induction:   PONV Risk Score and Plan: 2 and Scopolamine patch - Pre-op  Airway Management Planned: Natural Airway  Additional Equipment: None  Intra-op Plan:   Post-operative Plan:   Informed Consent: I have reviewed the patients History and Physical, chart, labs and discussed the procedure including the risks, benefits and alternatives for the proposed anesthesia with the patient or authorized representative who has indicated his/her understanding and acceptance.       Plan Discussed with: Anesthesiologist  Anesthesia Plan Comments: (  )        Anesthesia Quick Evaluation

## 2020-01-10 NOTE — Interval H&P Note (Signed)
History and Physical Interval Note:  01/10/2020 3:58 PM  Anna Long  has presented today for surgery, with the diagnosis of repeat C-Section.  The various methods of treatment have been discussed with the patient and family. After consideration of risks, benefits and other options for treatment, the patient has consented to  Procedure(s) with comments: CESAREAN SECTION (N/A) - Request RNFA as a surgical intervention.  The patient's history has been reviewed, patient examined, no change in status, stable for surgery.  I have reviewed the patient's chart and labs.  Questions were answered to the patient's satisfaction.     Caron Tardif M Luzelena Heeg

## 2020-01-10 NOTE — Op Note (Signed)
C-Section Operative Note  Date: 01/10/20  Preoperative Diagnosis: IUP @ 37 5/7, h/o csx x1, fetal arrhythmia Postoperative Diagnosis: Same as Pecola Lawless Procedures: ERLTCS Indications: Per MFM Surgeon: Ellison Hughs, MD Findings: Viable female infant with APGARS of 9 and 9 at 1 and 5 minutes, respectively. Normal appearing uterus, bilateral fallopian tubes and ovaries. Specimens: Placenta to L&D EBL 445 IVF 900 UOP 50  Patient Course: 41YS [id number] @ 37 5/7 w/ h/o csx x1 presented with persistent fetal arrhythmia at term with h/o prior IUFD @ 29wks 2/2 T18. S/p MFM fetal echo on 12/6, recommended moving ahead with ERLTCS. Patient agreed. NICU attending and staff notified of case prior to start  Consent:  R/B/A of cesarean section discussed with patient. Alternative would be vaginal delivery which would mean shorter postpartum stay and decreased risk of bleeding. Risks of section include infection of the uterus, pelvic organs, or skin, inadvertent injury to internal organs, such as bowel or bladder. If there is major injury, extensive surgery may be required. If injury is minor, it may be treated with relative ease. Discussed possibility of excessive blood loss and transfusion. If bleeding cannot be controlled using medical or minor surgical methods, a cesarean hysterectomy may be performed which would mean no future fertility. Patient accepts the possibility of blood transfusion, if necessary. Patient understands and agrees to move forward with section.   Operative Procedure: Patient was taken to the operating room where epidural anesthesia was found to be adequate by Allis clamp test. She was prepped and draped in the normal sterile fashion in the dorsal supine position with a leftward tilt. An appropriate time out was performed. A Pfannenstiel skin incision was then made with the scalpel and carried through to the underlying layer of fascia by sharp dissection and Bovie cautery. The fascia was nicked  in the midline and the incision was extended laterally with Mayo scissors. The superior aspect of the incision was grasped Coker clamps and dissected off the underlying rectus muscles. In a similar fashion the inferior aspect was dissected off the rectus muscles. Rectus muscles were separated in the midline and the peritoneal cavity entered bluntly. The peritoneal incision was then extended both superiorly and inferiorly with careful attention to avoid both bowel and bladder. The Alexis self-retaining wound retractor was then placed within the incision and the lower uterine segment exposed. The bladder flap was developed with Metzenbaum scissors and pushed away from the lower uterine segment. The lower uterine segment was then incised in a low transverse fashion and the cavity itself entered bluntly. The incision was extended bluntly. Amniotic sac was ruptured and fluid was noted to be clear in color. The infant's head was then lifted and delivered from the incision without difficulty using the standard movements. The remainder of the infant delivered and the nose and mouth bulb suctioned with the cord clamped and cut as well. The infant was handed off to NICU. The placenta was then spontaneously expressed from the uterus and the uterus cleared of all clots and debris with moist lap sponge. The uterine incision was then repaired in 2 layers the first layer was a running locked layer of 0-vicryl and the second an imbricating layer of the same suture. The tubes and ovaries were inspected and the gutters cleared of all clots and debris. The uterine incision was inspected and found to be hemostatic. All instruments and sponges as well as the Alexis retractor were then removed from the abdomen. The fascia was then closed with 0 Vicryl  in a running fashion. Subcutaneous tissue was reapproximated with 3-0 plain in a running fashion. The skin was closed with a subcuticular stitch of 4-0 Vicryl on a Keith needle and then  reinforced with Dermabond and a honeycomb dressing. At the conclusion of the procedure all instruments and sponge counts were correct. Patient was taken to the recovery room in good condition with her baby accompanying her skin to skin.

## 2020-01-10 NOTE — Anesthesia Procedure Notes (Signed)
Spinal  Patient location during procedure: OR Start time: 01/10/2020 5:33 PM End time: 01/10/2020 5:37 PM Staffing Anesthesiologist: Bethena Midget, MD Preanesthetic Checklist Completed: patient identified, IV checked, site marked, risks and benefits discussed, surgical consent, monitors and equipment checked, pre-op evaluation and timeout performed Spinal Block Patient position: sitting Prep: DuraPrep Patient monitoring: heart rate, cardiac monitor, continuous pulse ox and blood pressure Approach: midline Location: L3-4 Injection technique: single-shot Needle Needle type: Sprotte  Needle gauge: 24 G Needle length: 9 cm Assessment Sensory level: T4

## 2020-01-10 NOTE — Transfer of Care (Signed)
Immediate Anesthesia Transfer of Care Note  Patient: Anna Long  Procedure(s) Performed: CESAREAN SECTION (N/A )  Patient Location: PACU  Anesthesia Type:Spinal  Level of Consciousness: awake, alert  and oriented  Airway & Oxygen Therapy: Patient Spontanous Breathing  Post-op Assessment: Report given to RN and Post -op Vital signs reviewed and stable  Post vital signs: Reviewed and stable  Last Vitals:  Vitals Value Taken Time  BP 131/82 01/10/20 1850  Temp    Pulse 78 01/10/20 1857  Resp 16 01/10/20 1857  SpO2 100 % 01/10/20 1857  Vitals shown include unvalidated device data.  Last Pain:  Vitals:   01/10/20 1504  TempSrc: Oral  PainSc: 0-No pain      Patients Stated Pain Goal: 4 (01/10/20 1504)  Complications: No complications documented.

## 2020-01-11 ENCOUNTER — Encounter (HOSPITAL_COMMUNITY): Payer: Self-pay | Admitting: Obstetrics and Gynecology

## 2020-01-11 DIAGNOSIS — Z98891 History of uterine scar from previous surgery: Secondary | ICD-10-CM

## 2020-01-11 LAB — CBC
HCT: 31.4 % — ABNORMAL LOW (ref 36.0–46.0)
Hemoglobin: 10.2 g/dL — ABNORMAL LOW (ref 12.0–15.0)
MCH: 29.9 pg (ref 26.0–34.0)
MCHC: 32.5 g/dL (ref 30.0–36.0)
MCV: 92.1 fL (ref 80.0–100.0)
Platelets: 201 10*3/uL (ref 150–400)
RBC: 3.41 MIL/uL — ABNORMAL LOW (ref 3.87–5.11)
RDW: 13.9 % (ref 11.5–15.5)
WBC: 9.9 10*3/uL (ref 4.0–10.5)
nRBC: 0 % (ref 0.0–0.2)

## 2020-01-11 NOTE — Progress Notes (Signed)
Patient ambulated to bathroom with assistance with STEDY and RN. Patient did well and was educated on foley removal and when the next void should take place. Patient states she will call out for help when she feels the urge to void.

## 2020-01-11 NOTE — Social Work (Signed)
CSW received consult for hx of Anxiety and Depression.  CSW met with MOB to offer support and complete assessment.     CSW introduced self and role. CSW observed newborn in bassinet and FOB in chair. CSW asked MOB if she would like to speak alone to respect privacy, MOB declined and sated FOB could remain in the room. MOB and FOB were pleasant and engaged throughout interaction. CSW informed MOB of reason for consult. MOB expressed understanding. MOB reported she was diagnosed with anxiety following the passing of her father in 2003. MOB stated she was on medications at that time but could not recall the name. MOB stated the medications were helpful. MOB reported she attended therapy in 2016 with a [place] practice, which was helpful in dealing with symptoms. MOB shared she did not experience anxiety during the pregnancy, and overall had a good pregnancy. MOB reported she is currently feeling well and denies any current SI or HI. Aside from FOB, MOB stated she has a good support system which includes friends. CSW provided education regarding the baby blues period vs. perinatal mood disorders, discussed treatment and offered resources for mental health follow up if concerns arise. MOB declined. CSW recommends self-evaluation during the postpartum time period using the New Mom Checklist from Postpartum Progress and encouraged MOB to contact a medical professional if symptoms are noted at any time.   CSW provided review of Sudden Infant Death Syndrome (SIDS) precautions.  MOB reported newborn will sleep in a bassinet. MOB stated she has all of the essential needs for newborn, including a car seat. MOB identified [facility] for follow-up care and denies any transportation barriers. MOB declined having any additional needs at this time.    CSW identifies no further need for intervention and no barriers to discharge at this time.  Darra Lis, [employer] [phone]

## 2020-01-11 NOTE — Progress Notes (Signed)
Patient ID: Anna Long, female   DOB: 10/11/1985, 34 y.o.   MRN: [id number] Pt doing well. Has some mild residual diffuse itching - improved with benadryl; also reports mild incisional pain but able to ambulate with no issues. She is voiding well, tolerating diet and denies HA, CP or blurry vision.. Bonding well with baby. Desires circumcision for him VSS - 99/71 GEN- NAD ABD - ff, incision clear and dry EXT - no homans, +2 edema b/l  9.9>10.2<201  A/P: POD#1 s/p repeat c/s for fetal arrythmia         Routine pp/post op care         Plan circumcision today - reviewed with pt and husband

## 2020-01-12 MED ORDER — OXYCODONE HCL 5 MG PO TABS: 5.0000 mg | ORAL_TABLET | ORAL | 0 refills | Status: DC | PRN

## 2020-01-12 MED ORDER — IBUPROFEN 800 MG PO TABS: 800.0000 mg | ORAL_TABLET | Freq: Three times a day (TID) | ORAL | 0 refills | Status: DC | PRN

## 2020-01-12 NOTE — Social Work (Signed)
CSW received and acknowledges consult for EDPS of 9.  Consult screened out due to 9 on EDPS does not warrant a CSW consult.  MOB whom scores are greater than 9/yes to question 10 on Edinburgh Postpartum Depression Screen warrants a CSW consult. CSW has met with MOB and addressed postpartum mental health.  Darra Lis, MSW, [employer] [phone]

## 2020-01-12 NOTE — Progress Notes (Signed)
POD #2 LTCS Doing well, minimal pain, wants to go home Afeb, VSS Abd- soft, fundus firm, incision intact Continue routine care, D/c home if baby ok to go

## 2020-01-12 NOTE — Discharge Summary (Signed)
Postpartum Discharge Summary      Patient Name: Anna Long DOB: 08-24-1985 MRN: [id number]  Date of admission: 01/10/2020 Delivery date:01/10/2020  Delivering provider: Carlisle Cater  Date of discharge: 01/12/2020  Admitting diagnosis: History of cesarean section [Z98.891] S/P repeat low transverse C-section [Z98.891] Intrauterine pregnancy: [redacted]w[redacted]d     Secondary diagnosis:  Active Problems:   History of cesarean section   S/P repeat low transverse C-section     Discharge diagnosis: Term Pregnancy Delivered and fetal arrhythmia, previous c-section                                      Hospital course: Sceduled C/S   34 y.o. yo [id number] at [redacted]w[redacted]d was admitted to the hospital 01/10/2020 for scheduled cesarean section with the following indication:Prior Uterine Surgery and fetal arrhythmia.Delivery details are as follows:  Membrane Rupture Time/Date: 6:00 PM ,01/10/2020   Delivery Method:C-Section, Low Transverse  Details of operation can be found in separate operative note.  Patient had an uncomplicated postpartum course.  She is ambulating, tolerating a regular diet, passing flatus, and urinating well. Patient is discharged home in stable condition on  01/12/20        Newborn Data: Birth date:01/10/2020  Birth time:6:01 PM  Gender:Female  Living status:Living  Apgars:9 ,9  Weight:3805 g      Physical exam  Vitals:   01/11/20 0942 01/11/20 1318 01/11/20 1940 01/12/20 0550  BP: 114/75 99/71 114/76 (!) 131/92  Pulse: (!) 58 (!) 59 74 74  Resp: 17 18 18 18   Temp: 98.1 F (36.7 C) 98 F (36.7 C) 98.5 F (36.9 C) 97.8 F (36.6 C)  TempSrc:   Oral Oral  SpO2: 98%  99% 100%  Weight:      Height:       General: alert Lochia: appropriate Uterine Fundus: firm Incision: Healing well with no significant drainage  Labs: Lab Results  Component Value Date   WBC 9.9 01/11/2020   HGB 10.2 (L) 01/11/2020   HCT 31.4 (L) 01/11/2020   MCV 92.1 01/11/2020   PLT 201 01/11/2020    No flowsheet data found. Edinburgh Score: Edinburgh Postnatal Depression Scale Screening Tool 01/11/2020  I have been able to laugh and see the funny side of things. 0  I have looked forward with enjoyment to things. 0  I have blamed myself unnecessarily when things went wrong. 3  I have been anxious or worried for no good reason. 2  I have felt scared or panicky for no good reason. 2  Things have been getting on top of me. 1  I have been so unhappy that I have had difficulty sleeping. 0  I have felt sad or miserable. 0  I have been so unhappy that I have been crying. 1  The thought of harming myself has occurred to me. 0  Edinburgh Postnatal Depression Scale Total 9      After visit meds:  Allergies as of 01/12/2020      Reactions   Zithromax [azithromycin] Other (See Comments)   Was told not to take this medication by PCP      Medication List    STOP taking these medications   aspirin EC 81 MG tablet   folic acid 800 MCG tablet Commonly known as: FOLVITE     TAKE these medications   ibuprofen 800 MG tablet Commonly known as: ADVIL Take 1  tablet (800 mg total) by mouth every 8 (eight) hours as needed for moderate pain.   levothyroxine 100 MCG tablet Commonly known as: SYNTHROID Take 100 mcg by mouth daily before breakfast.   oxyCODONE 5 MG immediate release tablet Commonly known as: Oxy IR/ROXICODONE Take 1 tablet (5 mg total) by mouth every 4 (four) hours as needed for severe pain.   prenatal multivitamin Tabs tablet Take 1 tablet by mouth daily at 12 noon.        Discharge home in stable condition Infant Feeding: Breast Infant Disposition:home with mother Discharge instruction: per After Visit Summary and Postpartum booklet. Activity: Advance as tolerated. Pelvic rest for 6 weeks.  Diet: routine diet  Postpartum Appointment:2 weeks Follow up Visit:  Follow-up Information    Huel Cote, MD. Schedule an appointment as soon as possible for a  visit in 2 week(s).   Specialty: Obstetrics and Gynecology Contact information: [address] [place] [address] [phone]                   01/12/2020 Zenaida Niece, MD

## 2020-01-12 NOTE — Discharge Instructions (Signed)
As per discharge pamphlet °

## 2020-01-17 ENCOUNTER — Encounter (HOSPITAL_COMMUNITY)
Admission: RE | Admit: 2020-01-17 | Discharge: 2020-01-17 | Disposition: A | Discharge status: Home / Self Care | Payer: Self-pay | Source: Ambulatory Visit | Attending: Obstetrics and Gynecology | Admitting: Obstetrics and Gynecology

## 2020-01-17 ENCOUNTER — Other Ambulatory Visit (HOSPITAL_COMMUNITY): Payer: Self-pay

## 2020-01-21 NOTE — Anesthesia Postprocedure Evaluation (Signed)
Anesthesia Post Note  Patient: Anna Long  Procedure(s) Performed: CESAREAN SECTION (N/A )     Patient location during evaluation: PACU Anesthesia Type: Spinal Level of consciousness: oriented and awake and alert Pain management: pain level controlled Vital Signs Assessment: post-procedure vital signs reviewed and stable Respiratory status: spontaneous breathing, respiratory function stable and patient connected to nasal cannula oxygen Cardiovascular status: blood pressure returned to baseline and stable Postop Assessment: no headache, no backache and no apparent nausea or vomiting Anesthetic complications: no   No complications documented.  Last Vitals:  Vitals:   01/11/20 1940 01/12/20 0550  BP: 114/76 (!) 131/92  Pulse: 74 74  Resp: 18 18  Temp: 36.9 C 36.6 C  SpO2: 99% 100%    Last Pain:  Vitals:   01/12/20 1015  TempSrc:   PainSc: 0-No pain                 Makilah Dowda

## 2020-02-26 ENCOUNTER — Other Ambulatory Visit: Payer: Self-pay

## 2020-02-26 ENCOUNTER — Inpatient Hospital Stay (HOSPITAL_COMMUNITY)
Admission: AD | Admit: 2020-02-26 | Discharge: 2020-02-26 | Disposition: A | Discharge status: Home / Self Care | Payer: Self-pay | Attending: Obstetrics and Gynecology | Admitting: Obstetrics and Gynecology

## 2020-02-26 ENCOUNTER — Encounter (HOSPITAL_COMMUNITY): Payer: Self-pay | Admitting: Obstetrics and Gynecology

## 2020-02-26 DIAGNOSIS — N920 Excessive and frequent menstruation with regular cycle: Secondary | ICD-10-CM

## 2020-02-26 DIAGNOSIS — Z881 Allergy status to other antibiotic agents status: Secondary | ICD-10-CM | POA: Insufficient documentation

## 2020-02-26 LAB — CBC
HCT: 39.9 % (ref 36.0–46.0)
Hemoglobin: 12.8 g/dL (ref 12.0–15.0)
MCH: 28 pg (ref 26.0–34.0)
MCHC: 32.1 g/dL (ref 30.0–36.0)
MCV: 87.3 fL (ref 80.0–100.0)
Platelets: 312 10*3/uL (ref 150–400)
RBC: 4.57 MIL/uL (ref 3.87–5.11)
RDW: 15 % (ref 11.5–15.5)
WBC: 6.8 10*3/uL (ref 4.0–10.5)
nRBC: 0 % (ref 0.0–0.2)

## 2020-02-26 MED ORDER — MEGESTROL ACETATE 40 MG PO TABS: ORAL_TABLET | ORAL | 0 refills | Status: AC

## 2020-02-26 NOTE — MAU Note (Signed)
..  Anna Long is a 35 y.o. at 7 weeks postpartum here in MAU reporting: Vaginal bleeding since 02/17/2020 and filling a pad every couple hours with quarter sized clots. She told her OB office and they sent her to MAU for an ultrasound. Reports abdominal period-like cramping. Pain score: 4/10 Vitals:   02/26/20 2014  BP: 137/87  Pulse: 89  Resp: 16  Temp: 98.6 F (37 C)  SpO2: 100%

## 2020-02-26 NOTE — MAU Provider Note (Signed)
None     Chief Complaint:  Vaginal Bleeding   Anna Long is  35 y.o. [id number].  No LMP recorded..  Her pregnancy status is negative.  She presents complaining of heavy bleeding.  Had a repeat CS 7 weeks ago without complications.  Started bleeding last Saturday, has seen some clots. Is not breastfeeding.  Is not on birth control    Past Medical History:  Diagnosis Date  . Anxiety    no meds currently  . GERD (gastroesophageal reflux disease)    no meds, diet controlled  . Hypothyroidism   . Normal newborn (single liveborn)     Past Surgical History:  Procedure Laterality Date  . CESAREAN SECTION     x 1 in [facility]  . CESAREAN SECTION N/A 01/10/2020   Procedure: CESAREAN SECTION;  Surgeon: Carlisle Cater, MD;  Location: [facility] ORS;  Service: Obstetrics;  Laterality: N/A;  Request RNFA  . DILATION AND EVACUATION N/A 06/16/2017   Procedure: DILATATION AND EVACUATION;  Surgeon: Huel Cote, MD;  Location: [facility] ORS;  Service: Gynecology;  Laterality: N/A;  . WISDOM TOOTH EXTRACTION      Family History  Problem Relation Age of Onset  . Anxiety disorder Mother   . Depression Mother   . Diabetes Maternal Grandfather   . Arthritis Paternal Grandmother     Social History   Tobacco Use  . Smoking status: Never Smoker  . Smokeless tobacco: Never Used  Vaping Use  . Vaping Use: Never used  Substance Use Topics  . Alcohol use: Not Currently    Comment: occasional  . Drug use: Never    Allergies:  Allergies  Allergen Reactions  . Zithromax [Azithromycin] Other (See Comments)    Was told not to take this medication by PCP    Medications Prior to Admission  Medication Sig Dispense Refill Last Dose  . ibuprofen (ADVIL) 800 MG tablet Take 1 tablet (800 mg total) by mouth every 8 (eight) hours as needed for moderate pain. 30 tablet 0 02/25/2020 at Unknown time  . levothyroxine (SYNTHROID) 100 MCG tablet Take 100 mcg by mouth daily before breakfast.    02/26/2020  at Unknown time  . oxyCODONE (OXY IR/ROXICODONE) 5 MG immediate release tablet Take 1 tablet (5 mg total) by mouth every 4 (four) hours as needed for severe pain. 10 tablet 0   . Prenatal Vit-Fe Fumarate-FA (PRENATAL MULTIVITAMIN) TABS tablet Take 1 tablet by mouth daily at 12 noon.        Review of Systems   Constitutional: Negative for fever and chills Eyes: Negative for visual disturbances Respiratory: Negative for shortness of breath, dyspnea Cardiovascular: Negative for chest pain or palpitations  Gastrointestinal: Negative for vomiting, diarrhea and constipation Genitourinary: Negative for dysuria and urgency Musculoskeletal: Negative for back pain, joint pain, myalgias  Neurological: Negative for dizziness and headaches     Physical Exam     General: General appearance - alert, well appearing, and in no distress and oriented to person, place, and time Chest - normal respiratory effort Heart - normal rate and regular rhythm Abdomen - soft, nontender Pelvic - staining only on peripad in MAU.  SSE:  Small amount of dark red blood in vagina, cx non friable.  Extremities - no pedal edema noted   Labs: Results for orders placed or performed during the hospital encounter of 02/26/20 (from the past 24 hour(s))  CBC   Collection Time: 02/26/20  7:35 PM  Result Value Ref Range  WBC 6.8 4.0 - 10.5 K/uL   RBC 4.57 3.87 - 5.11 MIL/uL   Hemoglobin 12.8 12.0 - 15.0 g/dL   HCT 24.4 62.8 - 63.8 %   MCV 87.3 80.0 - 100.0 fL   MCH 28.0 26.0 - 34.0 pg   MCHC 32.1 30.0 - 36.0 g/dL   RDW 17.7 11.6 - 57.9 %   Platelets 312 150 - 400 K/uL   nRBC 0.0 0.0 - 0.2 %   Imaging Studies:  No results found.   Assessment: Period  Plan: Offered Megace to stop this bleeding.  Pt accepted.    Jacklyn Shell

## 2022-03-22 IMAGING — US US MFM OB DETAIL+14 WK
1 series · 12 of 28 positions shown · non-contrast
Comparison: none

[Series 1: us mfm ob detail+14 wk · 68 acquisitions, 12 frames shown]
[im 3/68]
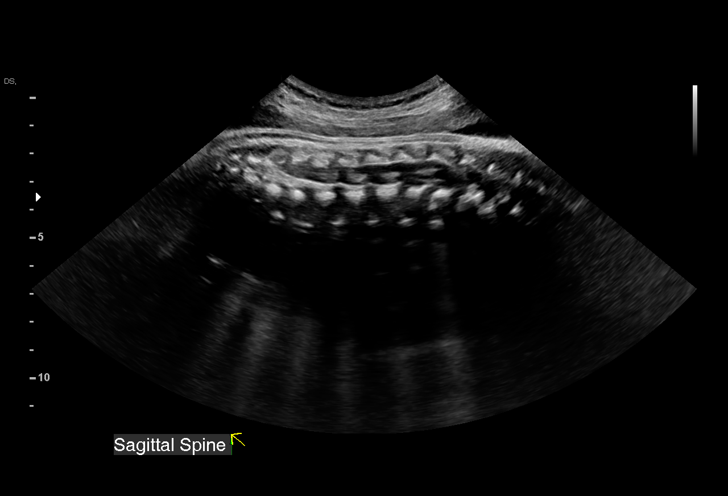
[im 8/68]
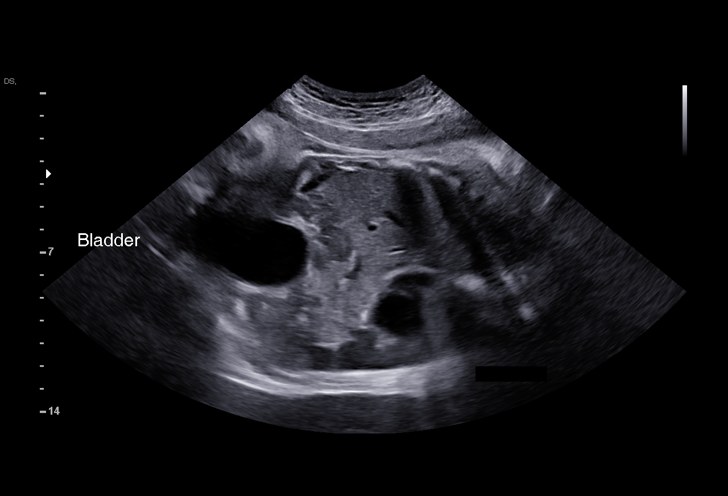
[im 13/68]
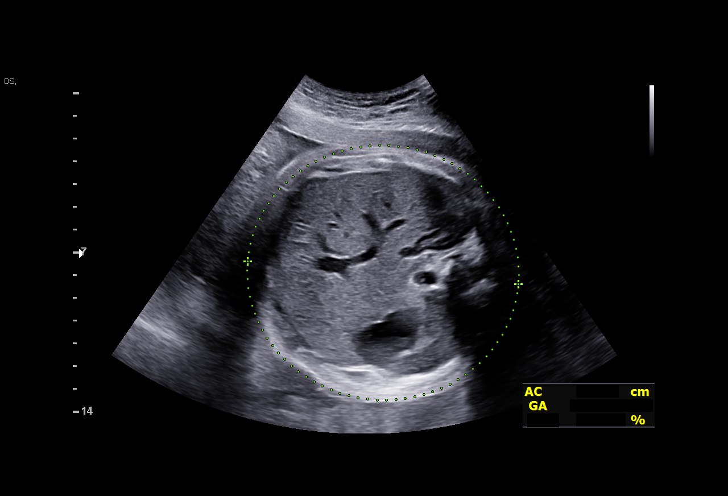
[im 20/68]
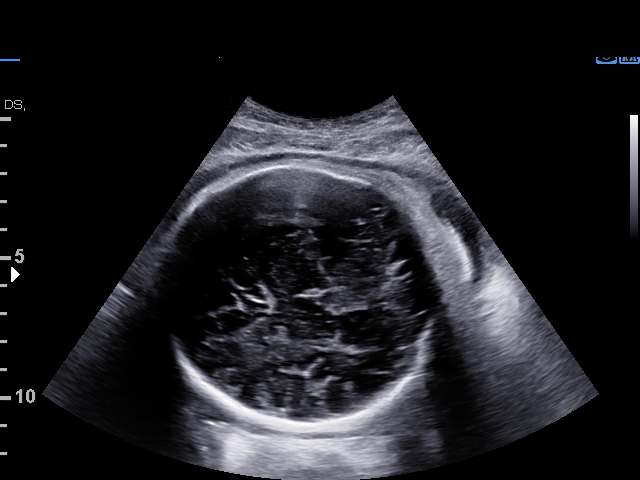
[im 25/68]
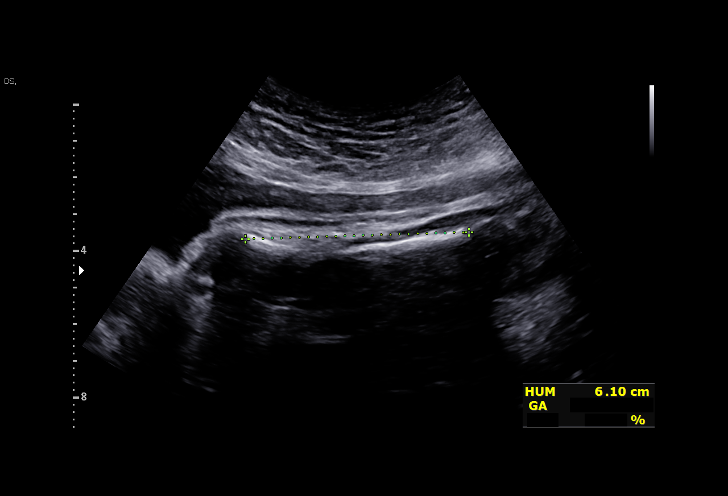
[im 30/68]
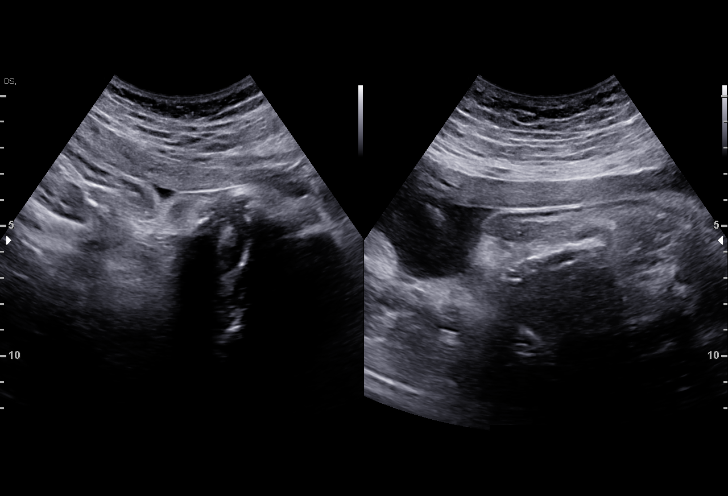
[im 38/68]
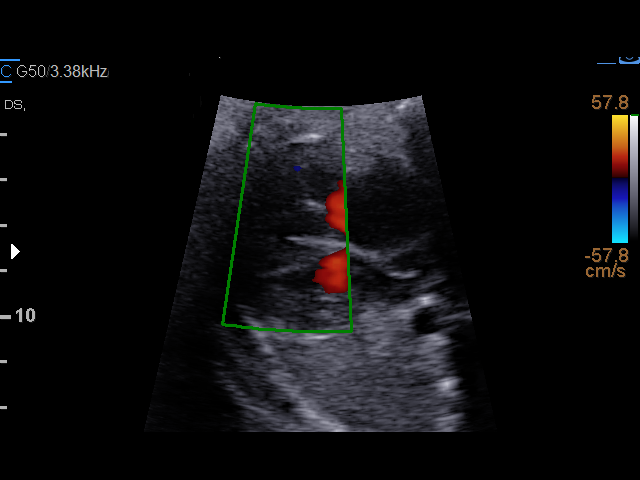
[im 43/68]
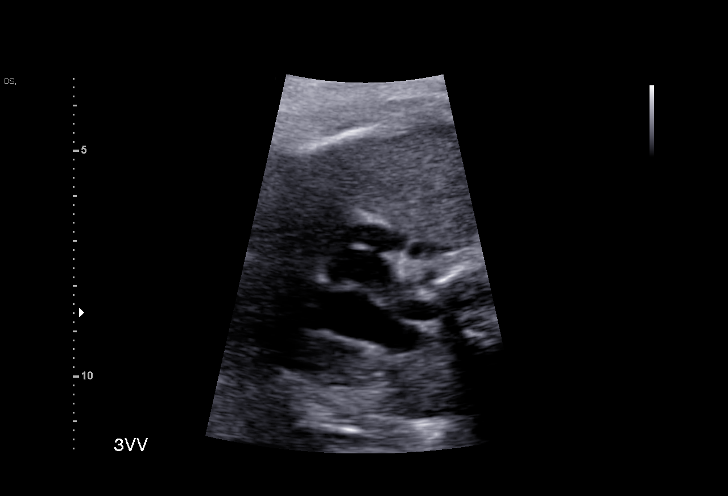
[im 48/68]
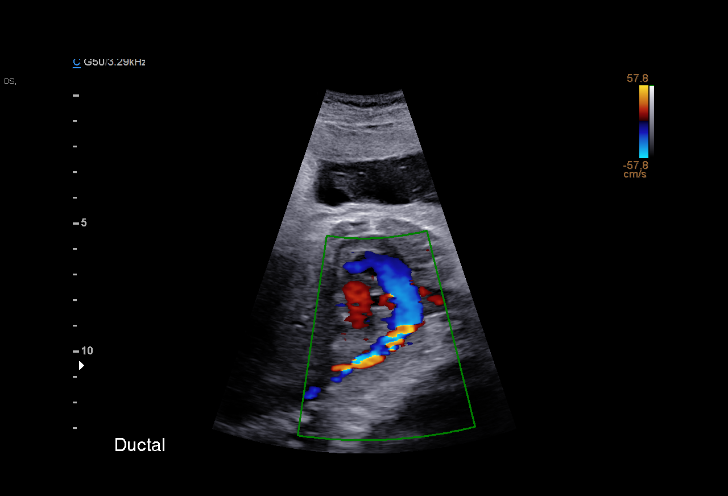
[im 55/68]
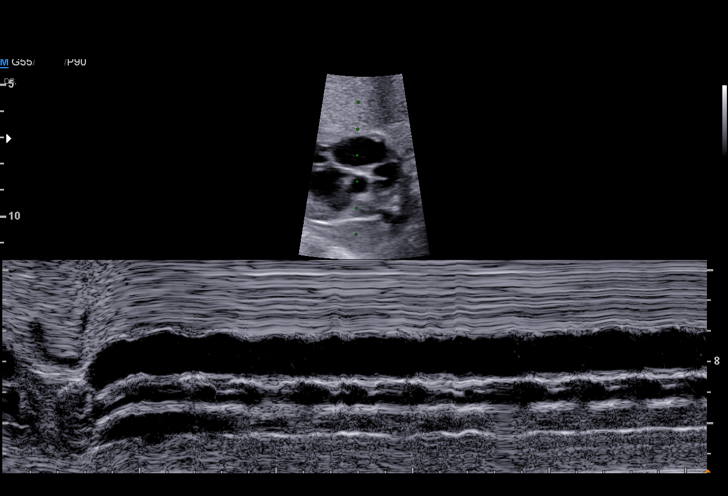
[im 60/68]
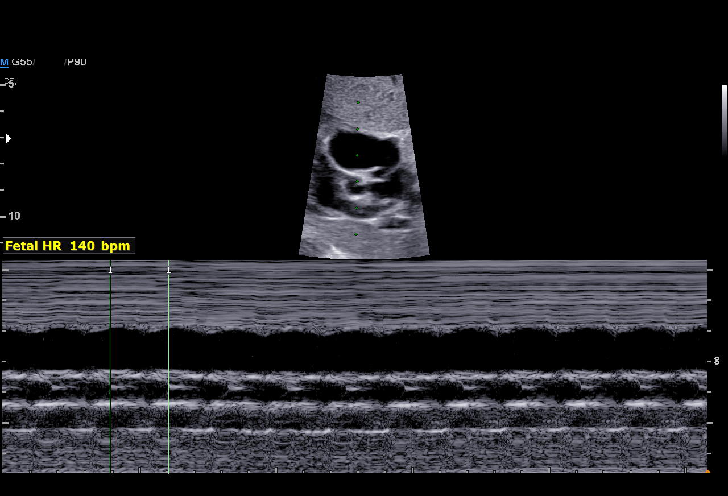
[im 65/68]
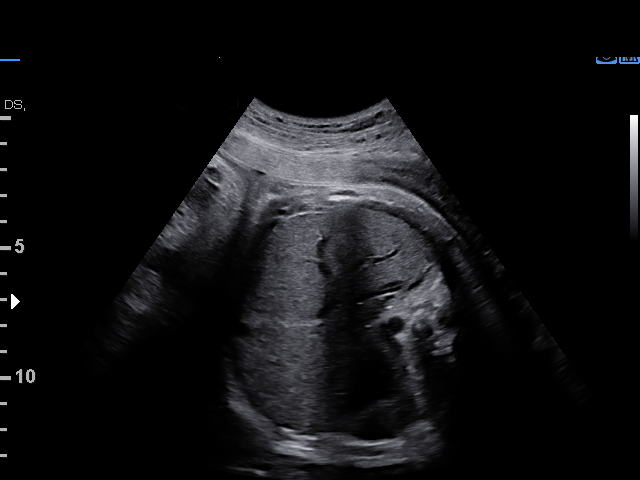

[12 of 28 positions shown; findings below may reference images not displayed]

[HOSPITAL],
                                                            Inc.

Indications

 Fetal arrhythmia affecting pregnancy,
 antepartum
 Poor obstetric history: Previous IUFD
 (stillbirth) Trisomy 18 3535
 Advanced maternal age multigravida 35+,
 third trimester
 Encounter for antenatal screening for
 malformations
 37 weeks gestation of pregnancy
Fetal Evaluation

 Num Of Fetuses:         1
 Fetal Heart Rate(bpm):  137
 Cardiac Activity:       Arrhythmia noted
 Presentation:           Cephalic
 Placenta:               Posterior Fundal
 P. Cord Insertion:      Not well visualized

 Amniotic Fluid
 AFI FV:      Within normal limits

 AFI Sum(cm)     %Tile       Largest Pocket(cm)
 14.26           54
 RUQ(cm)       RLQ(cm)       LUQ(cm)        LLQ(cm)


 Comment:    Infrequent premature atrial contractions observed (Conducted
             and blocked demonstrated).
Biometry

 BPD:      92.7  mm     G. Age:  37w 5d         73  %    CI:        81.32   %    70 - 86
                                                         FL/HC:      21.0   %    20.9 -
 HC:      324.5  mm     G. Age:  36w 5d         12  %    HC/AC:      0.89        0.92 -
 AC:      364.3  mm     G. Age:  40w 2d       > 99  %    FL/BPD:     73.7   %    71 - 87
 FL:       68.3  mm     G. Age:  35w 1d        4.6  %    FL/AC:      18.7   %    20 - 24
 HUM:      61.4  mm     G. Age:  35w 4d         37  %
 CER:      54.4  mm     G. Age:  38w 1d         70  %
 LV:        4.4  mm

 Est. FW:    4070  gm    7 lb 12 oz      82  %
OB History

 Gravidity:    5         Term:   1        Prem:   1        SAB:   2
 Living:       1
Gestational Age

 LMP:           37w 4d        Date:  04/21/19                 EDD:   01/26/20
 U/S Today:     37w 3d                                        EDD:   01/27/20
 Best:          37w 4d     Det. By:  LMP  (04/21/19)          EDD:   01/26/20
Anatomy

 Cranium:               Appears normal         LVOT:                   Appears normal
 Cavum:                 Appears normal         Aortic Arch:            Appears normal
 Ventricles:            Appears normal         Ductal Arch:            Appears normal
 Choroid Plexus:        Appears normal         Diaphragm:              Appears normal
 Cerebellum:            Appears normal         Stomach:                Appears normal, left
                                                                       sided
 Posterior Fossa:       Appears normal         Abdomen:                Appears normal
 Nuchal Fold:           Not applicable (>20    Abdominal Wall:         Appears nml (cord
                        wks GA)                                        insert, abd wall)
 Face:                  Appears normal         Cord Vessels:           Appears normal (3
                        (orbits and profile)                           vessel cord)
 Lips:                  Appears normal         Kidneys:                Appear normal
 Palate:                Appears normal         Bladder:                Appears normal
 Thoracic:              Appears normal         Spine:                  Appears normal
 Heart:                 Appears normal         Upper Extremities:      Appears normal
                        (4CH, axis, and
                        situs)
 RVOT:                  Appears normal         Lower Extremities:      Appears normal

 Other:  Fetus appears to be a male. Nasal bone visualized. Technically
         difficult due to advanced gestational age. VC, 3VV and 3VTV
         visualized. Suboptimal visualization of hands and feet.
Cervix Uterus Adnexa
 Cervix
 Not visualized (advanced GA >81wks)
Comments

 This patient was seen for a detailed fetal anatomy scan as a
 fetal arrhythmia was noted starting about 1 week ago.  The
 patient reports that she was diagnosed with Xovid-TV at
 around 24 weeks of her current pregnancy.  She reports a
 history of hypothyroidism that is currently treated with
 levothyroxine.  She denies any other significant past medical
 history.  Her last pregnancy was complicated by a fetal
 demise at around 29 weeks.  That fetus was known to have
 trisomy 18.  The patient reports feeling vigorous fetal
 movements throughout the day. She denies excessive
 caffeine use.
 Her blood pressures in our office today were mildly elevated
 130/92 and 146/83.
 She was informed that the fetal growth and amniotic fluid
 level were appropriate for her gestational age.
 The views of the fetal anatomy were limited today due to the
 fetal position and her advanced gestational age.  There were
 no obvious cardiac anomalies noted.
 The patient was informed that anomalies may be missed due
 to technical limitations. If the fetus is in a suboptimal position
 or maternal habitus is increased, visualization of the fetus in
 the maternal uterus may be impaired.
 On today's exam, the majority of the fetal heart rate was
 regular.  An occasional dropped beat was noted.  The patient
 was advised that the occasional dropped beat is most likely
 due to premature atrial contractions (PACs).  She was
 advised that PACs are usually benign and will be present on
 one visit and may not be present on the next visit.  PACs
 generally will resolve after birth.  As there were no signs of
 hydrops noted today, she was reassured that her baby most
 likely has not been affected by the arrhythmia noted today.
 Although rare, PACs occasionally may progress to
 supraventricular tachycardia with a fetal heart rate greater
 than 200.
 The patient had a nonstress test performed in our office
 following her ultrasound exam today.  The NST showed many
 dropped beats.  It would most likely have been a reactive
 tracing had the dropped beats not been present.
 Due to her history of a prior IUFD, the fetal arrhythmia noted
 today, and her mildly elevated blood pressures, I would
 recommend delivery sometime this week to decrease the risk
 of another adverse pregnancy outcome.  Her baby should be
 examined after birth to determine if the fetal arrhythmia is
 present and to determine if a referral to pediatric cardiology is
 necessary.  The patient and her husband were happy with the
 plan of delivery sometime this week.
 A total of 30 minutes was spent counseling and coordinating
 the care for this patient.

## 2023-10-10 NOTE — Progress Notes (Signed)
 [facility]  Urgent Care  [place] PA-C  Patient ID:  Anna Long is a 38 y.o.  Jul 22, 1985   female    Back Pain (Left side lower back pain,  reports once the day progresses pain will radiate into L lower ab pain. Reports nausea at time. Reports seen in ER and urgent care recently for the same however no improvement )    The following information was reviewed by members of the visit team:  Tobacco  Allergies  Meds  Problems  Med Hx  Surg Hx  Fam Hx     Medical History[1] Problem List[2]  History of Present Illness This is a 38 year old female with a history of hypoparathyroidism presenting with left-sided low back pain that radiates into her left lower abdomen as the day progresses, accompanied by intermittent nausea.  The patient reports that she was seen at [facility] on 09/24/2023 for upper back pain that radiated to her low back for 2 weeks. She was informed by another urgent care that the pain was musculoskeletal in nature. She describes the pain as heavy, like a rock, and can feel a knot in the area. The pain is particularly noticeable when she wakes up in the morning, although it does not disturb her sleep. She has been seeking treatment from a chiropractor and a massage therapist, but deep tissue massage has not provided relief. She has been using heat therapy but not ice. She has been taking Motrin  600 mg once daily, which seems to help, but she is hesitant to take other medications due to fear. She has been prescribed meloxicam and methocarbamol for muscle-related issues.  The patient also reports elevated blood pressure, which is unusual for her. She experienced chest pain and a rapid heartbeat last weekend, which she attributes to anxiety over her health status. She was informed of having inflammation and was checked for a blood clot. A CT scan and x-rays were performed. She occasionally experiences reflux or heartburn but is  not currently taking any medication for it. She has been experiencing nausea but no diarrhea. She had a bowel movement yesterday and can pass gas and burp. She took simethicone  yesterday.  She had an ultrasound done on 09/06/2023 to check for kidney stones, which was negative for stones but revealed a left kidney cyst. Her urine pregnancy test was negative, her urine was clear, and she had urine ketones of 3+. Her urine culture showed mixed urogenital flora with a colony count less than 10,000. She was prescribed Keflex 500 mg, 1 tablet twice a day for 1 week, which she completed.  She has hypoparathyroidism and takes extra calcium and Calcitrol.    Review of Systems: All others reviewed and negative except as listed above. Vitals:   10/10/23 1826 10/10/23 1944  BP: (!) 152/100 (!) 150/102  BP Location: Left arm Left arm  Patient Position: Sitting Sitting  Pulse: (!) 120 100  Resp: 18   Temp: 98.8 F (37.1 C)   TempSrc: Tympanic   SpO2: 100%   Weight: 56.7 kg (125 lb)   Height: 1.549 m (5' 1)    Physical Exam General Appearance: WN, WD NAD nontoxic or nonlethargic in appearance. Vital signs: Within normal limits. Head: Normocephalic, Atraumatic. Eyes: Conjunctiva clear. Ears: No obvious otorrhea. Nose: No obvious rhinorrhea. Mouth/Throat: Pink and Moist. Lymphatic: No Cervical, Supraclavicular Lymphadenopathy noted. Neck: No tenderness from cervical down through lumbar. Respiratory: Lungs are clear to auscultation bilaterally. No rales, rhonchi  or wheezing. Cardiovascular: Heart exhibits a regular rate, mildly tachycardic at 108. Gastrointestinal: Positive bowel sounds in the abdomen. No bruit noted. No pulsatile masses. Genitourinary:  Back, Musculoskeletal: Left paraspinitis upper traps, upper rhomboid SCM tight. Right paraspinatus muscles left. Left paraspinatus muscles down to her upper lumbar region are tight. There are multiple trigger points. Mild chest  compression. Extremities: Good ROM, No gross edema, good cap refill. Skin: Warm and dry, no rash. Neurological: Alert and oriented x3, Speech clear/fluent good understanding Face symmetrical. Psychiatric: Affect is normal.          I have personally review radiographs and my interpretation of the radiograph is:  no acute bony abnormality   Radiologist interpretation:  XR Abdomen 2 Views  Preliminary Result by Norman Shaker Combs, DO (09/07 2021)  XR ABDOMEN 2 VIEWS, 10/10/2023 7:39 PM     INDICATION: epigastric and left upper quadrant pain, Unspecified abdominal   pain \ R10.9 Unspecified abdominal pain   epigastric and left upper quadrant pain  COMPARISON: None.    TECHNIQUE: Supine and erect radiograph(s) of the abdomen were obtained.    FINDINGS:    . Gastrointestinal tract: Nonobstructive bowel gas pattern. No evidence of   pneumoperitoneum.  . Calcifications: No apparent pathologic calcification.  . Musculoskeletal: No acute bony abnormality.  . Lower chest: No acute process.  . Lines/Tubes/Surgical: None.  . Additional comments: None.    IMPRESSION:  No radiographic evidence for acute abdominal abnormality.    XR Chest 2 Views  Preliminary Result by Norman Shaker Combs, DO (09/07 2019)  XR CHEST 2 VIEWS, 10/10/2023 7:38 PM    INDICATION: chest wall pain, Other chest pain \ R07.89 Other chest pain   chest wall pain  COMPARISON: None    FINDINGS:     Cardiovascular: Cardiac silhouette and pulmonary vasculature are within   normal limits.  Mediastinum: Within normal limits.  Lungs/pleura: Clear.  Upper abdomen: Visualized portions are unremarkable.   Chest wall/osseous structures: Unremarkable.      IMPRESSION:  There is no evidence of acute cardiac or pulmonary abnormality.           Assessment & Plan Initial Assessment:  Left-sided low back pain radiating into the left lower abdomen with intermittent nausea. Elevated blood pressure and heart rate.  Abdominal pain likely due to gas accumulation.  Differential Diagnosis: - Musculoskeletal pain: Trigger point suspected. Short course of steroids and tizanidine prescribed. - Kidney stone: Unlikely due to negative ultrasound. Blood in urine possibly due to menstrual cycle. - Pancreatitis: Unlikely due to lack of typical symptoms. Blood work to be sent off. - Gastroesophageal Reflux Disease (GERD): Reports occasional reflux and heartburn. PPI prescribed.  Urgent Care Course: - Blood pressure 152/100, heart rate 120 - Urine pregnancy test negative - Urinalysis: Blood present, positive for ketones, negative leukocytes - Chest x-ray and abdominal x-ray obtained and reviewed - X-ray results: Gas and stool in intestines, no fractures or blockages - Blood work ordered: CMP, CBC, lipase  Final Assessment: Musculoskeletal pain with trigger points, gas accumulation causing abdominal pain, and elevated blood pressure likely due to pain.   Clinical Impression: - Musculoskeletal pain - Gastroesophageal Reflux Disease (GERD) - Hypertension  Disposition: - Discharge: Patient stable for discharge - Follow-Up: Ambulatory referral to family medicine. Blood work results to be communicated.  Red Flags associated with diagnosis/es were reviewed, action plan instructions given. Strict return Precautions given. Patient (and Family if applicable) agreed with plan and voiced understanding.  No barriers to adherence  perceived by myself. Portions of this note may have been dictated using Psychologist, clinical.  Disposition: Follow up with PCP   Electronically signed: Scarlette Vicci Needle, PA-C  10/10/2023 8:30 PM         [1] Past Medical History: Diagnosis Date  . Anxiety   . Complete spontaneous abortion (CMD)   . Depression   . Disease of nasal cavity and sinuses   . Hirsutism   . Hypothyroidism   . Infertility management   . Malaise and fatigue   . Overweight   .  Prolonged QT interval   [2] Patient Active Problem List Diagnosis  . Anxiety  . Prolonged QT interval  . Idiopathic hypoparathyroidism (HCC)  . Hypothyroidism

## 2023-10-20 ENCOUNTER — Ambulatory Visit: Payer: Self-pay | Attending: Obstetrics and Gynecology | Admitting: Physical Therapy

## 2023-10-20 ENCOUNTER — Other Ambulatory Visit: Payer: Self-pay

## 2023-10-20 DIAGNOSIS — R293 Abnormal posture: Secondary | ICD-10-CM | POA: Insufficient documentation

## 2023-10-20 DIAGNOSIS — M6281 Muscle weakness (generalized): Secondary | ICD-10-CM | POA: Insufficient documentation

## 2023-10-20 DIAGNOSIS — M62838 Other muscle spasm: Secondary | ICD-10-CM | POA: Insufficient documentation

## 2023-10-20 NOTE — Therapy (Signed)
 OUTPATIENT PHYSICAL THERAPY THORACOLUMBAR EVALUATION   Patient Name: Anna Long MRN: [id number] DOB:05-12-1985, 38 y.o., female Today's Date: 10/20/2023  END OF SESSION:  PT End of Session - 10/20/23 1559     Visit Number 1    Date for PT Re-Evaluation 12/15/23    Authorization Type self pay    PT Start Time 1532    PT Stop Time 1611    PT Time Calculation (min) 39 min    Activity Tolerance Patient tolerated treatment well    Behavior During Therapy WFL for tasks assessed/performed          Past Medical History:  Diagnosis Date   Anxiety    no meds currently   GERD (gastroesophageal reflux disease)    no meds, diet controlled   Hypothyroidism    Normal newborn (single liveborn)    Past Surgical History:  Procedure Laterality Date   CESAREAN SECTION     x 1 in [facility]   CESAREAN SECTION N/A 01/10/2020   Procedure: CESAREAN SECTION;  Surgeon: Sudie Lavonia HERO, MD;  Location: [facility] LD ORS;  Service: Obstetrics;  Laterality: N/A;  Request RNFA   DILATION AND EVACUATION N/A 06/16/2017   Procedure: DILATATION AND EVACUATION;  Surgeon: Estelle Service, MD;  Location: [facility] ORS;  Service: Gynecology;  Laterality: N/A;   WISDOM TOOTH EXTRACTION     Patient Active Problem List   Diagnosis Date Noted   S/P repeat low transverse C-section 01/11/2020   History of cesarean section 01/10/2020   IUFD at 20 weeks or more of gestation 12/08/2018   Trisomy 18 of fetus in current pregnancy 11/24/2018    PCP: Thurmond Cathlyn LABOR., MD   REFERRING PROVIDER: Estelle Service, MD  REFERRING DIAG: M54.9 (ICD-10-CM) - Dorsalgia, unspecified  Rationale for Evaluation and Treatment: Rehabilitation  THERAPY DIAG:  Muscle weakness (generalized)  Abnormal posture  Other muscle spasm  ONSET DATE: 1 month   SUBJECTIVE:                                                                                                                                                                                            SUBJECTIVE STATEMENT: Lt mid back pain radiating into Lt low back/glute and sometimes into neck. If pain gets high enough pain goes to full back. Has been to ED and urgent cares then gyn sent her here. Notes heart rate palpitations randomly and states she feels it but doesn't know why. Recently was told at ER/urgent care she has high BP and this is not her normal.  Did have abdominal and chest CT at urgent care reports normal but states she  did have a very rounded rib I think 8 or 9 but states radiologist said it's normal but at outside urgent care not in [facility].    PERTINENT HISTORY:  X2 c-sections, Hypothyroidism, hypoparathyroidism, anxiety  PAIN:  Are you having pain? Yes: NPRS scale: 2-8/10 Pain location: Lt mid back radiates to low back and neck and with higher levels of pain has whole back pain Pain description: spasm (high), achy (in morning) Aggravating factors: sometimes standing at work (a couple hours), walking, daily mobility increases pain, child care Relieving factors: rest, medication  PRECAUTIONS: None  RED FLAGS: None   WEIGHT BEARING RESTRICTIONS: No  FALLS:  Has patient fallen in last 6 months? No  LIVING ENVIRONMENT: Lives with: lives with their family Lives in: House/apartment   OCCUPATION: bank teller   PLOF: Independent  PATIENT GOALS: to have less pain to tolerate standing at window for work, picking up 38 year old  NEXT MD VISIT: nothing right now  OBJECTIVE:  Note: Objective measures were completed at Evaluation unless otherwise noted.  DIAGNOSTIC FINDINGS:    PATIENT SURVEYS:  Modified Oswestry:  MODIFIED OSWESTRY DISABILITY SCALE  Date: 10/20/23 Score  Pain intensity 3 =  Pain medication provides me with moderate relief from pain.  2. Personal care (washing, dressing, etc.) 2 =  It is painful to take care of myself, and I am slow and careful.  3. Lifting 5 =  I cannot lift or carry anything at all.  4. Walking 3 =   Pain prevents me from walking more than  mile.  5. Sitting 4 =  Pain prevents me from sitting more than 10 minutes.  6. Standing 4 =  Pain prevents me from standing more than 10 minutes.  7. Sleeping 1 = I can sleep well only by using pain medication.  8. Social Life 3 =  Pain prevents me from going out very often.  9. Traveling 3 = My pain restricts my travel over 1 hour  10. Employment/ Homemaking 4 = Pain prevents me from doing even light duties.  Total 32/50   Interpretation of scores: Score Category Description  0-20% Minimal Disability The patient can cope with most living activities. Usually no treatment is indicated apart from advice on lifting, sitting and exercise  21-40% Moderate Disability The patient experiences more pain and difficulty with sitting, lifting and standing. Travel and social life are more difficult and they may be disabled from work. Personal care, sexual activity and sleeping are not grossly affected, and the patient can usually be managed by conservative means  41-60% Severe Disability Pain remains the main problem in this group, but activities of daily living are affected. These patients require a detailed investigation  61-80% Crippled Back pain impinges on all aspects of the patient's life. Positive intervention is required  81-100% Bed-bound  These patients are either bed-bound or exaggerating their symptoms  Bluford FORBES Zoe DELENA Karon DELENA, et al. Surgery versus conservative management of stable thoracolumbar fracture: the PRESTO feasibility RCT. [place]): [employer]; 2021 Nov. [facility] Technology Assessment, No. 25.62.) Appendix 3, Oswestry Disability Index category descriptors. Available from: FindJewelers.cz  Minimally Clinically Important Difference (MCID) = 12.8%  COGNITION: Overall cognitive status: Within functional limits for tasks assessed     SENSATION: WFL    POSTURE: rounded shoulders and  decreased lumbar lordosis  PALPATION: TTP at Lt diaphragm, Lt to umbilicus, Lt lower abdominal quadrant, Lt thoracic paraspinals  Scar tissue noted at Lt c-section sites  LUMBAR  ROM:   AROM eval  Flexion Limited by 25%  Extension Limited by 25%  Right lateral flexion WFL  Left lateral flexion WFL  Right rotation Limited by 25%  Left rotation Limited by 25%   (Blank rows = not tested)  LOWER EXTREMITY ROM:     WFL  LOWER EXTREMITY MMT:    Bil hips grossly 4/5  FUNCTIONAL TESTS:  Sit up test - 2/3 mild pain in mid back  GAIT: WFL  TREATMENT DATE:                                                                                                                               10/20/23 Examination completed, findings reviewed, pt educated on POC, HEP. Pt motivated to participate in PT and agreeable to attempt recommendations.     PATIENT EDUCATION:  Education details: [email or web address] Person educated: Patient Education method: Explanation, Demonstration, Tactile cues, Verbal cues, and Handouts Education comprehension: verbalized understanding, returned demonstration, verbal cues required, tactile cues required, and needs further education  HOME EXERCISE PROGRAM: Access Code: [email or web address] URL: [email or web address] Date: 10/20/2023 Prepared by: Darryle  Exercises - Supine Abdominal Wall Massage  - 1 x daily - 7 x weekly - 1 sets - 2 mins holds - Supine Diaphragmatic Breathing  - 1 x daily - 7 x weekly - 1 sets - 10 reps - Sidelying Thoracic Rotation with Open Book  - 1 x daily - 7 x weekly - 1 sets - 10 reps - Snow Angels  - 1 x daily - 7 x weekly - 1 sets - 10 reps - 5s holds - Cat Cow  - 1 x daily - 7 x weekly - 1 sets - 10 reps - Child's Pose with Thread the Needle  - 1 x daily - 7 x weekly - 1 sets - 3 reps - 30s holds  ASSESSMENT:  CLINICAL IMPRESSION: Patient is a 38 y.o. female  who was seen today for physical therapy evaluation and treatment for mid back pain.  Pt reports she has been to outside [facility] urgent care and ED and reports no notable findings with abdominal CT and has been unable to find anything wrong. Pt reports she has noted increased instances of heart palpitations with this pain but unsure if this is stress related, has told MD about it as well. Pt had (-) findings with GYN. Pt found to have tension and TTP in Lt abdomen and thoracic paraspinals. Restricted rib mobility with expansion on Lt side. Also has decreased core strength, x2 c-sections with decreased mobility, impaired posture.  Pt would benefit from additional PT to further address deficits.     OBJECTIVE IMPAIRMENTS: decreased activity tolerance, decreased coordination, decreased endurance, decreased mobility, decreased ROM, decreased strength, increased fascial restrictions, increased muscle spasms, impaired flexibility, improper body mechanics, postural dysfunction, and pain.   ACTIVITY LIMITATIONS: carrying, lifting, sitting, standing, locomotion level, and caring for others  PARTICIPATION LIMITATIONS: meal prep, cleaning, laundry, shopping,  community activity, occupation, and yard work  PERSONAL FACTORS: Time since onset of injury/illness/exacerbation are also affecting patient's functional outcome.   REHAB POTENTIAL: Good  CLINICAL DECISION MAKING: Stable/uncomplicated  EVALUATION COMPLEXITY: Low   GOALS: Goals reviewed with patient? Yes  SHORT TERM GOALS: Target date: 11/17/23  Pt to be I with HEP for carry over and continuing recommendations for improved outcomes.   Baseline: Goal status: INITIAL  2.  Pt to demonstrate improved posture for at least 30 mins without increased pain.  Baseline:  Goal status: INITIAL  3.  Pt to demonstrate full ROM in spine in all directions for decreased strain at abdomen for improved posture Baseline:  Goal status: INITIAL   LONG TERM GOALS: Target date: 12/15/23  Pt to be I with advanced HEP for carry over and continuing  recommendations for improved outcomes.   Baseline:  Goal status: INITIAL  2.  Pt to report no more than 2/10 pain with standing at least 1 hour for improved tolerance to a full work day.  Baseline:  Goal status: INITIAL  3.  Pt to report no more than 2/10 pain with walking at least 1 hour for improved tolerance to grocery shopping.  Baseline:  Goal status: INITIAL  4.  Pt to report ability to carry 38 year old without increased pain due to improved core and hip strength without compensation.  Baseline:  Goal status: INITIAL    PLAN:  PT FREQUENCY: 2x/week  PT DURATION: 8 weeks  PLANNED INTERVENTIONS: 97110-Therapeutic exercises, 97530- Therapeutic activity, [id number]- Neuromuscular re-education, 97535- Self Care, 02859- Manual therapy, [phone]- Canalith repositioning, [id number]- Aquatic Therapy, [phone]- Electrical stimulation (manual), [id number]- Vasopneumatic device, [id number]- Ultrasound, [id number]- Traction (mechanical), [id number]- Ionotophoresis 4mg /ml Dexamethasone , 79439 (1-2 muscles), 20561 (3+ muscles)- Dry Needling, Patient/Family education, Balance training, Stair training, Taping, Joint mobilization, Joint manipulation, Spinal manipulation, Spinal mobilization, Scar mobilization, DME instructions, Cryotherapy, Moist heat, and Biofeedback.  PLAN FOR NEXT SESSION: dry needling?, stretching mid back, core beginner strengthening, manual at rib cage   Darryle Navy, PT, DPT 09/17/259:07 PM  [facility] [address] [place], [place] [address] Phone # [phone] Fax [phone]

## 2023-11-03 ENCOUNTER — Ambulatory Visit: Payer: Self-pay | Attending: Obstetrics and Gynecology

## 2023-11-03 ENCOUNTER — Telehealth: Payer: Self-pay

## 2023-11-03 DIAGNOSIS — R293 Abnormal posture: Secondary | ICD-10-CM | POA: Insufficient documentation

## 2023-11-03 DIAGNOSIS — M6281 Muscle weakness (generalized): Secondary | ICD-10-CM | POA: Insufficient documentation

## 2023-11-03 DIAGNOSIS — M62838 Other muscle spasm: Secondary | ICD-10-CM | POA: Insufficient documentation

## 2023-11-03 NOTE — Telephone Encounter (Signed)
 Placed phone call to patients listed number but wrong number.  Call placed to patients spouse listed as secondary contact.  No answer but able to leave voice mail.  Explained that patients phone number is incorrect and requested her to call to give correct number.  Also that she had an appt this morning at 8:45 am and did not show.  Explained that we were just checking in to make sure she was ok and would like to go over attendance policy and be sure she would be here for her next appt.

## 2023-11-12 ENCOUNTER — Ambulatory Visit: Payer: Self-pay

## 2023-11-24 ENCOUNTER — Encounter: Payer: Self-pay | Admitting: Physical Therapy

## 2023-12-07 ENCOUNTER — Ambulatory Visit: Payer: Self-pay

## 2023-12-07 NOTE — Telephone Encounter (Signed)
  FYI Only or Action Required?: FYI only for provider: appointment scheduled on 12/15/2023.  Patient was last seen in primary care on new patient.  Called Nurse Triage reporting Abdominal Pain.  Symptoms began several months ago.  Interventions attempted: OTC medications: omeprazole.  Symptoms are: unchanged.  Triage Disposition: See PCP Within 2 Weeks  Patient/caregiver understands and will follow disposition?: Yes Copied from CRM [id number]. Topic: Clinical - Red Word Triage >> Dec 07, 2023  9:45 AM Charlet HERO wrote: Red Word that prompted transfer to Nurse Triage: Paitent is calling about pain on her left side for a couple of months it got worst today and it felt like she wanted to throw up. Wants appt for [place]. Reason for Disposition  Abdominal pain is a chronic symptom (recurrent or ongoing AND present > 4 weeks)  Answer Assessment - Initial Assessment Questions Patient has been to several physicians including GI and ED. Has had CT, negative ED in September for chest pain. Evaluated for MI and blood clot, all negative Gets full fast after eating, nausea, 15 pound weight loss GI, no endoscopy or colonoscopy indicated Has been treated for reflux with omeprazole, only took for a couple of days. She did have relief, but she read online that it should not be taken for a long time, so stopped after four days  1. LOCATION: Where does it hurt?      Upper abdomen, back and ribs 2. RADIATION: Does the pain shoot anywhere else? (e.g., chest, back)     Up toward chest 3. ONSET: When did the pain begin? (e.g., minutes, hours or days ago)      Four months ago, began as back pain 4. SUDDEN: Gradual or sudden onset?     gradual 5. PATTERN Does the pain come and go, or is it constant?     Comes and goes, worsened by eating.  A couple of hours after eating, experiences abdominal pain 6. SEVERITY: How bad is the pain?  (e.g., Scale 1-10; mild, moderate, or severe)     When  having pain, 8/10 7. RECURRENT SYMPTOM: Have you ever had this type of stomach pain before? If Yes, ask: When was the last time? and What happened that time?      Has had heartburn during pregnancies 8. CAUSE: What do you think is causing the stomach pain? (e.g., gallstones, recent abdominal surgery)     Possibly GERD 9. RELIEVING/AGGRAVATING FACTORS: What makes it better or worse? (e.g., antacids, bending or twisting motion, bowel movement)     eating 10. OTHER SYMPTOMS: Do you have any other symptoms? (e.g., back pain, diarrhea, fever, urination pain, vomiting)       Nausea, weight loss 15 pounds 11. PREGNANCY: Is there any chance you are pregnant? When was your last menstrual period?       LMP 11/2023, states that she was late  Protocols used: Abdominal Pain - [facility]

## 2023-12-15 ENCOUNTER — Ambulatory Visit (HOSPITAL_BASED_OUTPATIENT_CLINIC_OR_DEPARTMENT_OTHER): Payer: Self-pay | Admitting: Family Medicine
